# Patient Record
Sex: Male | Born: 1960
Health system: Southern US, Community
[De-identification: ages and names within clinical notes are randomized; demographics above are authoritative.]

## PROBLEM LIST (undated history)

## (undated) DIAGNOSIS — K5090 Crohn's disease, unspecified, without complications: Secondary | ICD-10-CM

## (undated) DIAGNOSIS — K5792 Diverticulitis of intestine, part unspecified, without perforation or abscess without bleeding: Secondary | ICD-10-CM

## (undated) DIAGNOSIS — J439 Emphysema, unspecified: Secondary | ICD-10-CM

## (undated) DIAGNOSIS — K219 Gastro-esophageal reflux disease without esophagitis: Secondary | ICD-10-CM

## (undated) DIAGNOSIS — K589 Irritable bowel syndrome without diarrhea: Secondary | ICD-10-CM

## (undated) DIAGNOSIS — J449 Chronic obstructive pulmonary disease, unspecified: Secondary | ICD-10-CM

## (undated) HISTORY — DX: Emphysema, unspecified: J43.9

## (undated) HISTORY — DX: Diverticulitis of intestine, part unspecified, without perforation or abscess without bleeding: K57.92

## (undated) HISTORY — DX: Crohn's disease, unspecified, without complications: K50.90

## (undated) HISTORY — PX: NASAL SEPTUM SURGERY: SHX37

## (undated) HISTORY — DX: Chronic obstructive pulmonary disease, unspecified: J44.9

## (undated) HISTORY — PX: COLONOSCOPY: SHX174

## (undated) HISTORY — PX: SHOULDER SURGERY: SHX246

## (undated) HISTORY — DX: Gastro-esophageal reflux disease without esophagitis: K21.9

## (undated) HISTORY — PX: ANKLE SURGERY: SHX546

## (undated) HISTORY — DX: Irritable bowel syndrome, unspecified: K58.9

---

## 2014-10-15 HISTORY — PX: SHOULDER SURGERY: SHX246

## 2014-12-29 DIAGNOSIS — IMO0002 Reserved for concepts with insufficient information to code with codable children: Secondary | ICD-10-CM | POA: Insufficient documentation

## 2014-12-29 DIAGNOSIS — T300 Burn of unspecified body region, unspecified degree: Secondary | ICD-10-CM | POA: Insufficient documentation

## 2015-01-10 DIAGNOSIS — M75121 Complete rotator cuff tear or rupture of right shoulder, not specified as traumatic: Secondary | ICD-10-CM | POA: Insufficient documentation

## 2015-01-18 DIAGNOSIS — M7521 Bicipital tendinitis, right shoulder: Secondary | ICD-10-CM | POA: Insufficient documentation

## 2015-02-21 DIAGNOSIS — Z9889 Other specified postprocedural states: Secondary | ICD-10-CM | POA: Insufficient documentation

## 2015-09-06 DIAGNOSIS — M7550 Bursitis of unspecified shoulder: Secondary | ICD-10-CM | POA: Insufficient documentation

## 2016-07-16 ENCOUNTER — Other Ambulatory Visit: Payer: Self-pay | Admitting: Family Medicine

## 2016-07-16 ENCOUNTER — Encounter: Payer: Self-pay | Admitting: Family Medicine

## 2016-07-16 ENCOUNTER — Ambulatory Visit (INDEPENDENT_AMBULATORY_CARE_PROVIDER_SITE_OTHER): Payer: BLUE CROSS/BLUE SHIELD | Admitting: Family Medicine

## 2016-07-16 VITALS — BP 123/79 | HR 73 | Wt 171.0 lb

## 2016-07-16 DIAGNOSIS — R195 Other fecal abnormalities: Secondary | ICD-10-CM | POA: Diagnosis not present

## 2016-07-16 DIAGNOSIS — Z114 Encounter for screening for human immunodeficiency virus [HIV]: Secondary | ICD-10-CM | POA: Diagnosis not present

## 2016-07-16 DIAGNOSIS — N400 Enlarged prostate without lower urinary tract symptoms: Secondary | ICD-10-CM

## 2016-07-16 DIAGNOSIS — R102 Pelvic and perineal pain: Secondary | ICD-10-CM

## 2016-07-16 DIAGNOSIS — Z1159 Encounter for screening for other viral diseases: Secondary | ICD-10-CM | POA: Diagnosis not present

## 2016-07-16 MED ORDER — POLYETHYLENE GLYCOL 3350 17 GM/SCOOP PO POWD: 17.0000 g | Freq: Every day | ORAL | 1 refills | Status: DC

## 2016-07-16 MED ORDER — TAMSULOSIN HCL 0.4 MG PO CAPS: 0.4000 mg | ORAL_CAPSULE | Freq: Every day | ORAL | 3 refills | Status: DC

## 2016-07-16 NOTE — Progress Notes (Signed)
Jonathan Baker is a 55 y.o. male who presents to [facility] today to establish care.  Patient has a self reported history of Chron's, IBS, GERD, and diverticulitis.  He does not take any chronic medications.  He has several concerns being addressed today.  1.  Decreased stool caliber and constipation:  These symptoms started 4 weeks ago.  Patient reports difficulty defecating because it feels "tight" in his anus.  He has tried enemas several times.  His stools have been mostly watery or thin caliber.  He also endorses difficulty passing gas and generalized abdominal pain.  He denies nausea, vomiting, weight loss, and recent bloody stools.  His last colonoscopy was 5 years ago and was significant for several small adenomatous polyps and diverticulosis per his report.  Of note he has a first degree relative family history of lymphoma, pancreatic cancer, and breast cancer.  No family history of colorectal or prostate cancer.    2.  Chronic nocturia and urinary frequency:  Patient endorses incomplete emptying as well.  He claims he has to get up 2-3 times per night to urinate.  3. Polysubstance abuse:  Patient has >50 pack year smoking history.  He currently smokes 2 packs per day and drinks a case of beer per week.  He also smokes marijuana for joint pain.    4.  Health maintenance:  Due for HIV and Hep C screening.  Patient declines Tdap and flu shots.   Past Medical History:  Diagnosis Date  . Crohn disease ([place])   . Diverticulitis   . GERD (gastroesophageal reflux disease)   . IBS (irritable bowel syndrome)    Past Surgical History:  Procedure Laterality Date  . ANKLE SURGERY    . SHOULDER SURGERY     Social History  Substance Use Topics  . Smoking status: Not on file  . Smokeless tobacco: Not on file  . Alcohol use Not on file   family history is not on  file.  ROS as above: No headache, visual changes, nausea, vomiting, diarrhea, dizziness, skin rash, fevers, chills, night sweats, weight loss, swollen lymph nodes, body aches, joint swelling, muscle aches, chest pain, shortness of breath, mood changes, visual or auditory hallucinations.    Medications: Current Outpatient Prescriptions  Medication Sig Dispense Refill  . polyethylene glycol powder (GLYCOLAX/MIRALAX) powder Take 17 g by mouth daily. 850 g 1  . tamsulosin (FLOMAX) 0.4 MG CAPS capsule Take 1 capsule (0.4 mg total) by mouth daily. 30 capsule 3   No current facility-administered medications for this visit.    Allergies  Allergen Reactions  . Tetanus Toxoid Adsorbed Other (See Comments)    Pt states his whole body gets stiff.     Exam:  BP 123/79   Pulse 73   Wt 171 lb (77.6 kg)  Gen: Well NAD HEENT: EOMI,  MMM Lungs: Normal work of breathing. CTABL Heart: RRR no MRG Abd: NABS, Soft. Nondistended, Nontender Exts: Brisk capillary refill, warm and well perfused.  Rectal:  Significantly enlarged, Firm smooth prostate.  No rectal masses  No results found for this or any previous visit (from the past 24 hour(s)). No results found.    Assessment and Plan: 55 y.o. male presents to establish care.   He has several issues addressed today:   1.  Decreased stool caliber and constipation concerning for colorectal cancer.   -  Colonoscopy referral - CT abdomen and pelvis with contrast -  Take Miralax daily - Likely refer to GI after CT scan back.  2.  Nocturia, urinary frequency, and incomplete emptying with an enlarged prostate on rectal exam concerning for BPH.  Also worried about prostate cancer, though this is less likely without systemic symptoms and a smooth/symmetric prostate on exam.  - PSA  - Tamsulosin 0.4 mg daily - Refer to urology if PSA her CT scan is concerning for prostate cancer.  3.  Polysubstance abuse:  Pre-contemplative phase - Reassess at next  visit  4. Health maintenance:  Will check general labs (CBC, CMP, TSH, Vitamin D, A1c) - Hep C and HIV screening   Orders Placed This Encounter  Procedures  . CT Abdomen Pelvis W Contrast    Standing Status:   Future    Standing Expiration Date:   10/16/2017    Order Specific Question:   If indicated for the ordered procedure, I authorize the administration of contrast media per Radiology protocol    Answer:   Yes    Order Specific Question:   Reason for Exam (SYMPTOM  OR DIAGNOSIS REQUIRED)    Answer:   eval narrow stool concern colon cancer/prostate cancer    Order Specific Question:   Preferred imaging location?    Answer:   [facility]  . CBC with Differential/Platelet  . COMPLETE METABOLIC PANEL WITH GFR  . Hemoglobin A1c  . PSA, Total and Free  . TSH  . VITAMIN D 25 Hydroxy (Vit-D Deficiency, Fractures)  . Hepatitis C antibody  . HIV antibody    Discussed warning signs or symptoms. Please see discharge instructions. Patient expresses understanding.

## 2016-07-16 NOTE — Patient Instructions (Signed)
Thank you for coming in today. Get labs today.  Get CT scan soon.   Start flomax daily for urination.  Take miralax daily for stool.   Benign Prostatic Hyperplasia An enlarged prostate (benign prostatic hyperplasia) is common in older men. You may experience the following:  Weak urine stream.  Dribbling.  Feeling like the bladder has not emptied completely.  Difficulty starting urination.  Getting up frequently at night to urinate.  Urinating more frequently during the day. HOME CARE INSTRUCTIONS  Monitor your prostatic hyperplasia for any changes. The following actions may help to alleviate any discomfort you are experiencing:  Give yourself time when you urinate.  Stay away from alcohol.  Avoid beverages containing caffeine, such as coffee, tea, and colas, because they can make the problem worse.  Avoid decongestants, antihistamines, and some prescription medicines that can make the problem worse.  Follow up with your health care provider for further treatment as recommended. SEEK MEDICAL CARE IF:  You are experiencing progressive difficulty voiding.  Your urine stream is progressively getting narrower.  You are awaking from sleep with the urge to void more frequently.  You are constantly feeling the need to void.  You experience loss of urine, especially in small amounts. SEEK IMMEDIATE MEDICAL CARE IF:   You develop increased pain with urination or are unable to urinate.  You develop severe abdominal pain, vomiting, a high fever, or fainting.  You develop back pain or blood in your urine. MAKE SURE YOU:   Understand these instructions.  Will watch your condition.  Will get help right away if you are not doing well or get worse.   This information is not intended to replace advice given to you by your health care provider. Make sure you discuss any questions you have with your health care provider.   Document Released: 10/01/2005 Document Revised:  10/22/2014 Document Reviewed: 03/03/2013 Elsevier Interactive Patient Education [employer].

## 2016-07-17 ENCOUNTER — Encounter: Payer: Self-pay | Admitting: Family Medicine

## 2016-07-17 DIAGNOSIS — E559 Vitamin D deficiency, unspecified: Secondary | ICD-10-CM | POA: Insufficient documentation

## 2016-07-17 LAB — CBC WITH DIFFERENTIAL/PLATELET
BASOS ABS: 0 {cells}/uL (ref 0–200)
Basophils Relative: 0 %
EOS PCT: 1 %
Eosinophils Absolute: 78 cells/uL (ref 15–500)
HEMATOCRIT: 43.5 % (ref 38.5–50.0)
HEMOGLOBIN: 15 g/dL (ref 13.2–17.1)
LYMPHS ABS: 1716 {cells}/uL (ref 850–3900)
Lymphocytes Relative: 22 %
MCH: 31.8 pg (ref 27.0–33.0)
MCHC: 34.5 g/dL (ref 32.0–36.0)
MCV: 92.2 fL (ref 80.0–100.0)
MONO ABS: 702 {cells}/uL (ref 200–950)
MPV: 9.9 fL (ref 7.5–12.5)
Monocytes Relative: 9 %
NEUTROS PCT: 68 %
Neutro Abs: 5304 cells/uL (ref 1500–7800)
Platelets: 347 10*3/uL (ref 140–400)
RBC: 4.72 MIL/uL (ref 4.20–5.80)
RDW: 13.4 % (ref 11.0–15.0)
WBC: 7.8 10*3/uL (ref 3.8–10.8)

## 2016-07-17 LAB — PSA, TOTAL AND FREE
PSA, % Free: 25 % — ABNORMAL LOW (ref >25)
PSA, Free: 0.3 ng/mL
PSA, Total: 1.2 ng/mL (ref <=4.0)

## 2016-07-17 LAB — COMPLETE METABOLIC PANEL WITH GFR
ALT: 14 U/L (ref 9–46)
AST: 16 U/L (ref 10–35)
Albumin: 4.5 g/dL (ref 3.6–5.1)
Alkaline Phosphatase: 72 U/L (ref 40–115)
BUN: 11 mg/dL (ref 7–25)
CHLORIDE: 101 mmol/L (ref 98–110)
CO2: 26 mmol/L (ref 20–31)
CREATININE: 0.76 mg/dL (ref 0.70–1.33)
Calcium: 9.6 mg/dL (ref 8.6–10.3)
GFR, Est Non African American: >89 mL/min (ref >=60)
Glucose, Bld: 95 mg/dL (ref 65–99)
POTASSIUM: 4.3 mmol/L (ref 3.5–5.3)
Sodium: 136 mmol/L (ref 135–146)
Total Bilirubin: 1.1 mg/dL (ref 0.2–1.2)
Total Protein: 6.9 g/dL (ref 6.1–8.1)

## 2016-07-17 LAB — HIV ANTIBODY (ROUTINE TESTING W REFLEX): HIV: NONREACTIVE

## 2016-07-17 LAB — HEPATITIS C ANTIBODY: HCV AB: NEGATIVE

## 2016-07-17 LAB — HEMOGLOBIN A1C
Hgb A1c MFr Bld: 4.8 % (ref <5.7)
MEAN PLASMA GLUCOSE: 91 mg/dL

## 2016-07-17 LAB — TSH: TSH: 0.52 m[IU]/L (ref 0.40–4.50)

## 2016-07-17 LAB — VITAMIN D 25 HYDROXY (VIT D DEFICIENCY, FRACTURES): VIT D 25 HYDROXY: 25 ng/mL — AB (ref 30–100)

## 2016-07-19 ENCOUNTER — Ambulatory Visit (INDEPENDENT_AMBULATORY_CARE_PROVIDER_SITE_OTHER): Payer: BLUE CROSS/BLUE SHIELD

## 2016-07-19 DIAGNOSIS — N2889 Other specified disorders of kidney and ureter: Secondary | ICD-10-CM

## 2016-07-19 DIAGNOSIS — R102 Pelvic and perineal pain unspecified side: Secondary | ICD-10-CM

## 2016-07-19 DIAGNOSIS — N281 Cyst of kidney, acquired: Secondary | ICD-10-CM

## 2016-07-19 DIAGNOSIS — K5900 Constipation, unspecified: Secondary | ICD-10-CM | POA: Diagnosis not present

## 2016-07-19 DIAGNOSIS — K5730 Diverticulosis of large intestine without perforation or abscess without bleeding: Secondary | ICD-10-CM

## 2016-07-19 DIAGNOSIS — N400 Enlarged prostate without lower urinary tract symptoms: Secondary | ICD-10-CM

## 2016-07-19 DIAGNOSIS — R195 Other fecal abnormalities: Secondary | ICD-10-CM | POA: Diagnosis not present

## 2016-07-19 DIAGNOSIS — Q613 Polycystic kidney, unspecified: Secondary | ICD-10-CM

## 2016-07-19 MED ORDER — IOPAMIDOL (ISOVUE-300) INJECTION 61%
100.0000 mL | Freq: Once | INTRAVENOUS | Status: AC | PRN
  Administered 2016-07-19: 100 mL via INTRAVENOUS

## 2016-07-20 ENCOUNTER — Other Ambulatory Visit: Payer: Self-pay | Admitting: Family Medicine

## 2016-07-20 ENCOUNTER — Encounter: Payer: Self-pay | Admitting: Family Medicine

## 2016-07-20 DIAGNOSIS — D1803 Hemangioma of intra-abdominal structures: Secondary | ICD-10-CM | POA: Insufficient documentation

## 2016-07-20 DIAGNOSIS — K5790 Diverticulosis of intestine, part unspecified, without perforation or abscess without bleeding: Secondary | ICD-10-CM | POA: Insufficient documentation

## 2016-07-20 DIAGNOSIS — N2889 Other specified disorders of kidney and ureter: Secondary | ICD-10-CM

## 2016-07-20 DIAGNOSIS — N281 Cyst of kidney, acquired: Secondary | ICD-10-CM | POA: Insufficient documentation

## 2016-07-20 DIAGNOSIS — Q613 Polycystic kidney, unspecified: Secondary | ICD-10-CM | POA: Insufficient documentation

## 2016-07-20 DIAGNOSIS — I700 Atherosclerosis of aorta: Secondary | ICD-10-CM | POA: Insufficient documentation

## 2016-07-20 NOTE — Progress Notes (Signed)
I discussed results of the abdomen MRI with patient who expresses understanding and agreement..  Plan to follow-up with both gastroenterology and nephrologyI discussed results of the abdomen MRI with patient who expresses understanding and agreement..  Plan to follow-up with both gastroenterology and nephrology

## 2016-07-23 ENCOUNTER — Ambulatory Visit: Payer: BLUE CROSS/BLUE SHIELD

## 2016-07-23 DIAGNOSIS — N2889 Other specified disorders of kidney and ureter: Secondary | ICD-10-CM

## 2016-07-23 DIAGNOSIS — Q612 Polycystic kidney, adult type: Secondary | ICD-10-CM | POA: Diagnosis not present

## 2016-07-23 MED ORDER — GADOBENATE DIMEGLUMINE 529 MG/ML IV SOLN
20.0000 mL | Freq: Once | INTRAVENOUS | Status: AC | PRN
  Administered 2016-07-23: 16 mL via INTRAVENOUS

## 2016-07-24 ENCOUNTER — Telehealth: Payer: Self-pay | Admitting: Family Medicine

## 2016-07-24 NOTE — Telephone Encounter (Signed)
I discussed results of the abdomen MRI with patient who expresses understanding and agreement..  Plan to follow-up with both gastroenterology and nephrology

## 2016-08-01 ENCOUNTER — Other Ambulatory Visit: Payer: Self-pay | Admitting: Family Medicine

## 2016-08-01 DIAGNOSIS — Q613 Polycystic kidney, unspecified: Secondary | ICD-10-CM

## 2016-08-15 DIAGNOSIS — N529 Male erectile dysfunction, unspecified: Secondary | ICD-10-CM | POA: Insufficient documentation

## 2016-08-15 DIAGNOSIS — N400 Enlarged prostate without lower urinary tract symptoms: Secondary | ICD-10-CM | POA: Insufficient documentation

## 2016-08-15 DIAGNOSIS — N281 Cyst of kidney, acquired: Secondary | ICD-10-CM | POA: Diagnosis not present

## 2016-08-15 DIAGNOSIS — Q613 Polycystic kidney, unspecified: Secondary | ICD-10-CM | POA: Diagnosis not present

## 2016-11-09 ENCOUNTER — Other Ambulatory Visit: Payer: Self-pay

## 2016-11-09 DIAGNOSIS — R195 Other fecal abnormalities: Secondary | ICD-10-CM

## 2016-11-09 MED ORDER — POLYETHYLENE GLYCOL 3350 17 GM/SCOOP PO POWD: 17.0000 g | Freq: Every day | ORAL | 1 refills | Status: DC

## 2016-11-15 ENCOUNTER — Other Ambulatory Visit: Payer: Self-pay

## 2016-11-15 DIAGNOSIS — R195 Other fecal abnormalities: Secondary | ICD-10-CM

## 2016-11-15 DIAGNOSIS — N400 Enlarged prostate without lower urinary tract symptoms: Secondary | ICD-10-CM

## 2016-11-15 MED ORDER — TAMSULOSIN HCL 0.4 MG PO CAPS: 0.4000 mg | ORAL_CAPSULE | Freq: Every day | ORAL | 3 refills | Status: DC

## 2016-11-15 MED ORDER — POLYETHYLENE GLYCOL 3350 17 GM/SCOOP PO POWD: 17.0000 g | Freq: Every day | ORAL | 1 refills | Status: DC

## 2016-11-22 ENCOUNTER — Other Ambulatory Visit: Payer: Self-pay | Admitting: Family Medicine

## 2016-11-22 DIAGNOSIS — R195 Other fecal abnormalities: Secondary | ICD-10-CM

## 2016-11-27 ENCOUNTER — Telehealth: Payer: Self-pay | Admitting: Family Medicine

## 2016-11-27 NOTE — Telephone Encounter (Signed)
I called pt and spoke with him and he states that he DECLINES wanting a flu shot this year so please document this in pts chart. Thanks

## 2016-11-27 NOTE — Telephone Encounter (Signed)
Health maintenance updated

## 2017-03-05 DIAGNOSIS — R399 Unspecified symptoms and signs involving the genitourinary system: Secondary | ICD-10-CM | POA: Diagnosis not present

## 2017-03-05 DIAGNOSIS — N529 Male erectile dysfunction, unspecified: Secondary | ICD-10-CM | POA: Diagnosis not present

## 2017-03-05 DIAGNOSIS — Q613 Polycystic kidney, unspecified: Secondary | ICD-10-CM | POA: Diagnosis not present

## 2017-04-14 ENCOUNTER — Other Ambulatory Visit: Payer: Self-pay | Admitting: Family Medicine

## 2017-04-14 DIAGNOSIS — R195 Other fecal abnormalities: Secondary | ICD-10-CM

## 2017-04-26 ENCOUNTER — Other Ambulatory Visit: Payer: Self-pay | Admitting: *Deleted

## 2017-04-26 DIAGNOSIS — R195 Other fecal abnormalities: Secondary | ICD-10-CM

## 2017-04-26 MED ORDER — POLYETHYLENE GLYCOL 3350 17 GM/SCOOP PO POWD: ORAL | 1 refills | Status: DC

## 2017-04-29 ENCOUNTER — Ambulatory Visit: Payer: BLUE CROSS/BLUE SHIELD | Admitting: Family Medicine

## 2017-08-24 DIAGNOSIS — Q613 Polycystic kidney, unspecified: Secondary | ICD-10-CM | POA: Diagnosis not present

## 2017-08-29 IMAGING — MR MR ABDOMEN WO/W CM
17 of 18 series · 44 of 48 positions shown · IV contrast (multihance)
Comparison: 07/19/2016

CLINICAL DATA: Evaluate right kidney mass

EXAM:
MRI ABDOMEN WITHOUT AND WITH CONTRAST
TECHNIQUE: Multiplanar multisequence MR imaging of the abdomen was performed
both before and after the administration of intravenous contrast.
CONTRAST:  16mL MULTIHANCE GADOBENATE DIMEGLUMINE 529 MG/ML IV SOLN

[Series 2: cor ssfse / · coronal · 7.0mm · 1.48mm/px · 3 of 34 slices shown]
[im 1/34]
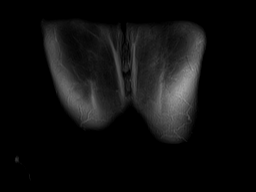
[im 17/34]
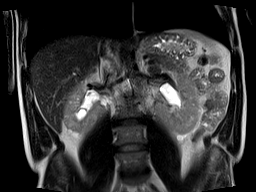
[im 34/34]
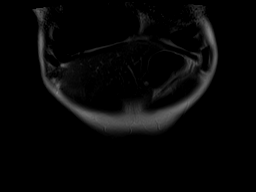

[Series 3: T2 · axial · 6.0mm · 1.48mm/px · z∈[-39,+170]mm · 2 of 30 slices shown]
[im 1/30]
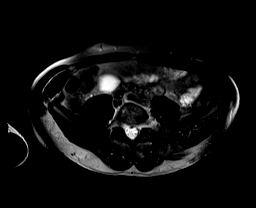
[im 30/30]
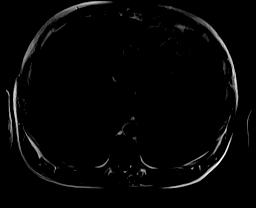

[Series 6: DWI · axial · 6.0mm · 2.00mm/px · z∈[-35,+166]mm · 5 of 87 slices shown (1 of 2)]
[im 1/87]
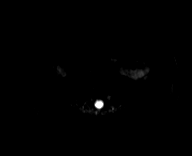
[im 22/87]
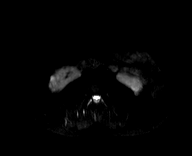
[im 44/87]
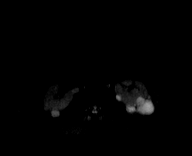
[im 65/87]
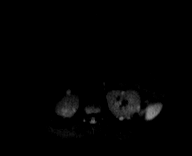
[im 87/87]
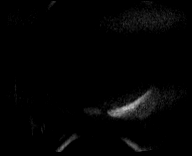

[Series 7: DWI · axial · 6.0mm · 2.00mm/px · z∈[-35,+166]mm · 2 of 29 slices shown (2 of 2)]
[im 1/29]
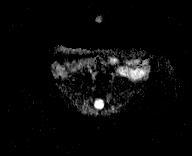
[im 29/29]
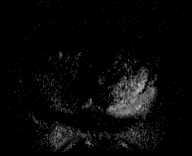

[Series 8: bSSFP · axial · 6.0mm · 0.74mm/px · z∈[-39,+170]mm · 2 of 30 slices shown]
[im 1/30]
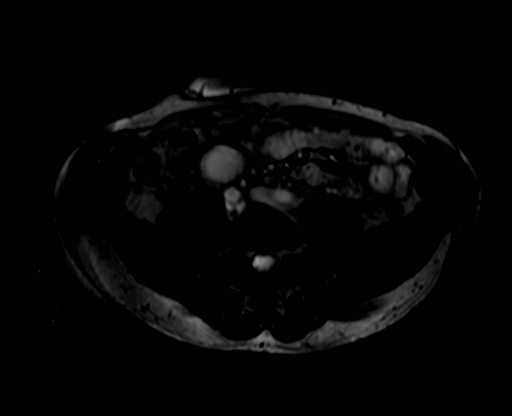
[im 30/30]
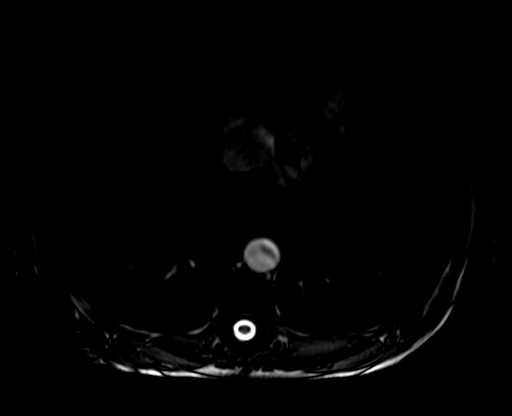

[Series 9: axial dynamic pre · axial · non-contrast · 4.0mm · 1.19mm/px · z∈[-60,+192]mm · 3 of 64 slices shown]
[im 1/64]
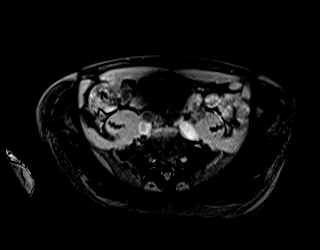
[im 32/64]
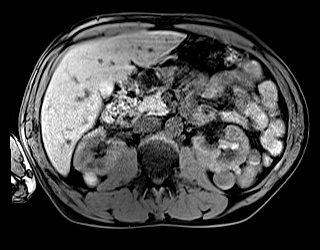
[im 64/64]
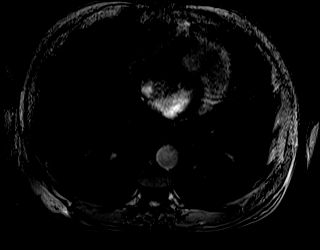

[Series 10: axial dynamic post · axial · 3.0mm · 1.19mm/px · z∈[-29,+160]mm · 3 of 64 slices shown (1 of 3)]
[im 1/64]
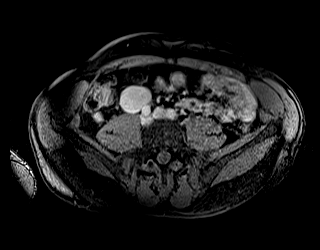
[im 32/64]
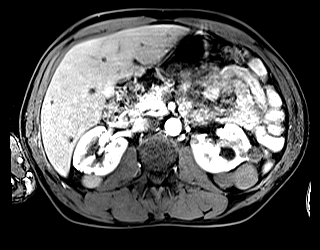
[im 64/64]
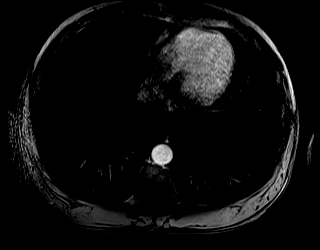

[Series 11: axial dynamic post · axial · 3.0mm · 1.19mm/px · z∈[-29,+160]mm · 3 of 64 slices shown (2 of 3)]
[im 1/64]
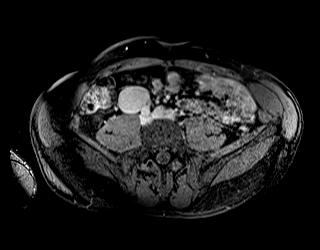
[im 32/64]
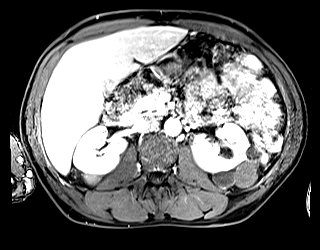
[im 64/64]
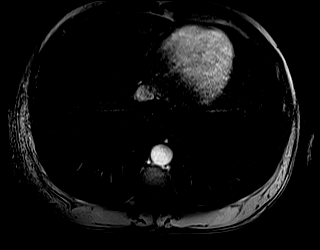

[Series 12: axial dynamic post · axial · 3.0mm · 1.19mm/px · z∈[-29,+160]mm · 3 of 64 slices shown (3 of 3)]
[im 1/64]
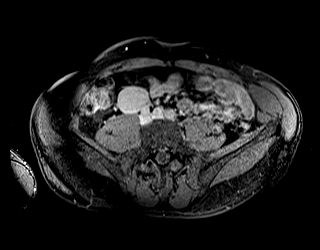
[im 32/64]
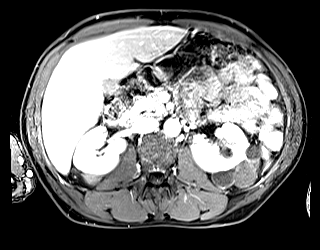
[im 64/64]
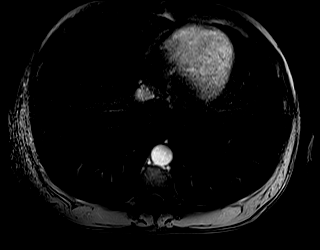

[Series 14: axial ssfse / · axial · 6.0mm · 1.19mm/px · 1 of 30 slices shown]
[im 1/30]
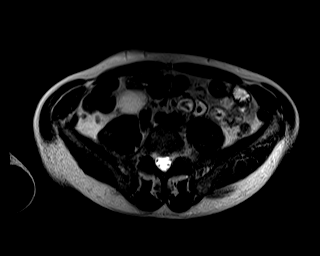

[Series 15: axial dynamic 3 · axial · 4.0mm · 1.19mm/px · z∈[-60,+192]mm · 3 of 64 slices shown]
[im 1/64]
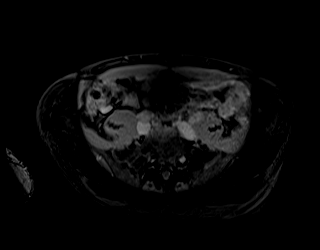
[im 32/64]
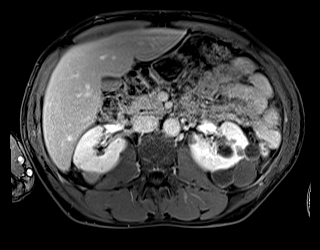
[im 64/64]
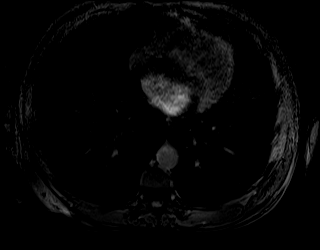

[Series 100: out of phase · axial · 6.0mm · 0.74mm/px · 1 of 30 slices shown]
[im 1/30]
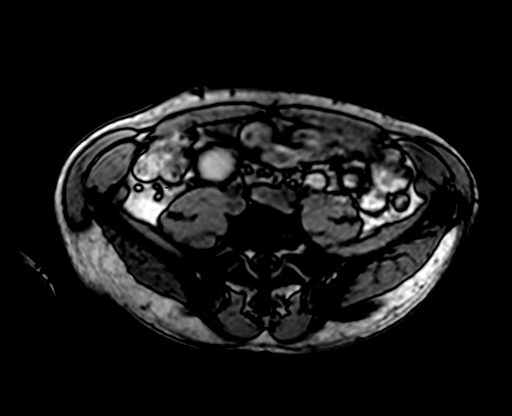

[Series 101: in phase · axial · 6.0mm · 0.74mm/px · 1 of 30 slices shown]
[im 1/30]
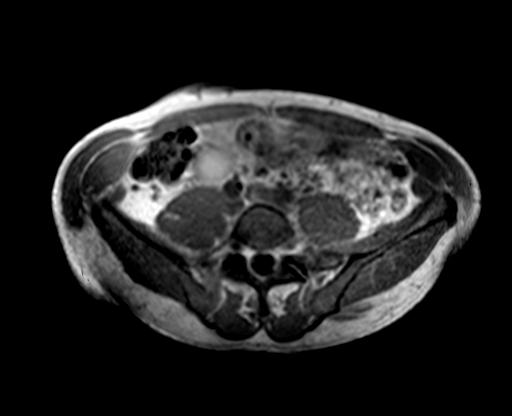

[Series 102: immediate · axial · 1.19mm/px · z∈[-45,+176]mm · 3 of 64 slices shown]
[im 1/64]
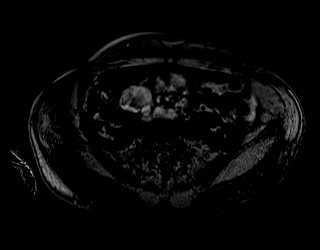
[im 32/64]
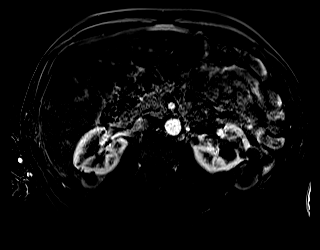
[im 64/64]
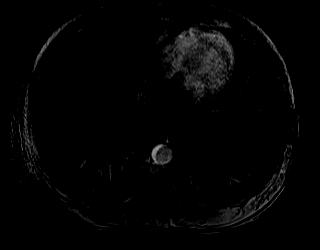

[Series 103: 45 sec delay · axial · delayed · 1.19mm/px · z∈[-45,+176]mm · 3 of 64 slices shown]
[im 1/64]
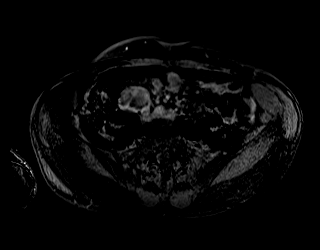
[im 32/64]
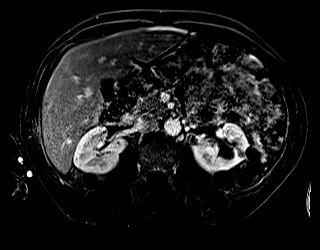
[im 64/64]
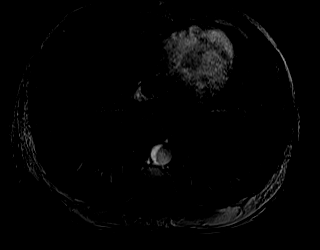

[Series 104: 90 sec delay · axial · delayed · 1.19mm/px · z∈[-45,+176]mm · 3 of 64 slices shown]
[im 1/64]
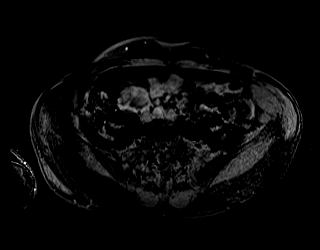
[im 32/64]
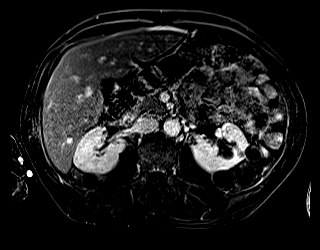
[im 64/64]
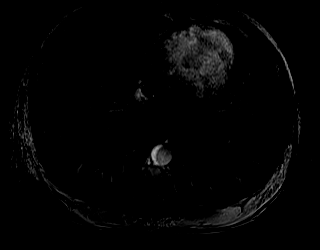

[Series 105: 3 min delay · axial · delayed · 4.0mm · 1.19mm/px · z∈[-60,+192]mm · 3 of 64 slices shown]
[im 1/64]
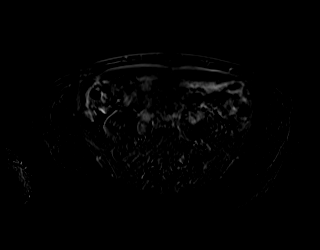
[im 32/64]
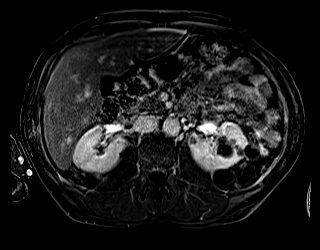
[im 64/64]
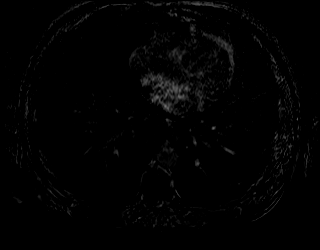

[44 of 48 positions shown; findings below may reference images not displayed]

FINDINGS: Lower chest: No pleural or pericardial effusion identified.

Hepatobiliary: Hemangioma along the dome of liver measures 1 cm,
image 5 of series 3. No suspicious enhancing kidney lesions
identified. Several tiny cysts are noted within the right lobe of
liver.

Pancreas: No mass, inflammatory changes, or other parenchymal
abnormality identified.

Spleen:  Within normal limits in size and appearance.

Adrenals/Urinary Tract: The adrenal glands appear normal. There are
numerous bilateral renal cysts of varying complexity. Some of these
are simple cysts. Others are hemorrhagic/proteinaceous cysts. The
lesion of concern arising from the posterior aspect of the upper
pole of the left kidney measures 2.8 cm. There is no enhancement
associated with this structure compatible with a Bosniak category 2
lesion. No definite enhancing kidney lesions identified at this
time.

Stomach/Bowel: Visualized portions within the abdomen are
unremarkable.

Vascular/Lymphatic: Normal appearance of the abdominal aorta. No
upper abdominal adenopathy.

Other: No free fluid or fluid collections identified within the
abdomen or pelvis.

Musculoskeletal: No suspicious bone lesions identified.
IMPRESSION: 1. Again noted are changes of polycystic kidney disease.
2. Multiple liver cysts have signal and enhancement characteristics
compatible with Bosniak category 1 and 2 lesions. No enhancing
kidney lesions identified at this time.
3. Liver hemangioma.

## 2018-04-16 ENCOUNTER — Ambulatory Visit (INDEPENDENT_AMBULATORY_CARE_PROVIDER_SITE_OTHER): Payer: BLUE CROSS/BLUE SHIELD | Admitting: Physician Assistant

## 2018-04-16 ENCOUNTER — Encounter: Payer: Self-pay | Admitting: Physician Assistant

## 2018-04-16 VITALS — BP 125/79 | HR 73 | Temp 98.1°F | Wt 175.0 lb

## 2018-04-16 DIAGNOSIS — L237 Allergic contact dermatitis due to plants, except food: Secondary | ICD-10-CM

## 2018-04-16 MED ORDER — PREDNISONE 20 MG PO TABS: ORAL_TABLET | ORAL | 0 refills | Status: DC

## 2018-04-16 MED ORDER — CLOBETASOL PROPIONATE 0.05 % EX CREA: 1.0000 "application " | TOPICAL_CREAM | Freq: Two times a day (BID) | CUTANEOUS | 0 refills | Status: AC

## 2018-04-16 NOTE — Progress Notes (Signed)
HPI:                                                                Jonathan Baker is a 57 y.o. male who presents to [facility]: [facility] today for rash  Onset: today Location: started on wrists and right , has spread to abdomen Duration: constant Character: extremely itchy Aggravating factors / Triggers: none Evolution: red, swollen bumps Treatments tried: anti-itch cream and Benadryl Reports cutting down some trees this weekend and weeding without wearing gloves Recent illness / systemic symptoms: none       No flowsheet data found.  No flowsheet data found.    Past Medical History:  Diagnosis Date  . Crohn disease ([place])   . Diverticulitis   . GERD (gastroesophageal reflux disease)   . IBS (irritable bowel syndrome)    Past Surgical History:  Procedure Laterality Date  . ANKLE SURGERY    . NASAL SEPTUM SURGERY    . SHOULDER SURGERY     Social History   Tobacco Use  . Smoking status: Current Every Day Smoker  . Smokeless tobacco: Never Used  Substance Use Topics  . Alcohol use: Yes   family history is not on file.    ROS: negative except as noted in the HPI  Medications: Current Outpatient Medications  Medication Sig Dispense Refill  . polyethylene glycol powder (GLYCOLAX/MIRALAX) powder take 17GM (DISSOLVED IN WATER) by mouth daily 850 g 1  . tamsulosin (FLOMAX) 0.4 MG CAPS capsule Take 1 capsule (0.4 mg total) by mouth daily. 30 capsule 3  . clobetasol cream (TEMOVATE) 9.41 % Apply 1 application topically 2 (two) times daily for 7 days. 15 g 0  . predniSONE (DELTASONE) 20 MG tablet 60 mg PO QD x 1 week, 40 mg PO QD x 1 week, 20 mg PO QD x 1 week 42 tablet 0   No current facility-administered medications for this visit.    Allergies  Allergen Reactions  . Tetanus Toxoid Adsorbed Other (See Comments)    Pt states his whole body gets stiff.       Objective:  BP 125/79   Pulse 73   Temp 98.1 F (36.7 C)  (Oral)   Wt 175 lb (79.4 kg)  Gen:  alert, not ill-appearing, no distress, appropriate for age HEENT: head normocephalic without obvious abnormality, conjunctiva and cornea clear, trachea midline Pulm: Normal work of breathing, normal phonation Neuro: alert and oriented x 3, no tremor MSK: extremities atraumatic, normal gait and station Skin: right eyelid/peiorbital area and right lateral neck with erythematous non-vesicular eruption and mild edema, scattered erythematous papules on the right forearm and abdomen, left wrist with erythematous eruption    No results found for this or any previous visit (from the past 72 hour(s)). No results found.    Assessment and Plan: 57 y.o. male with   Allergic contact dermatitis due to plants, except food - Plan: predniSONE (DELTASONE) 20 MG tablet, clobetasol cream (TEMOVATE) 0.05 % - due to facial involvement, recommend oral corticosteroids. 21 day prednisone taper. Clobetasol bid prn x 7 days, refrigerate for soothing effect. Counseled on general measures Cool compresses for 15-20 minutes per hour Colloidal Oatmeal Bath (Aveeno bath) Calamine lotion applied several times per day  Patient  education and anticipatory guidance given Patient agrees with treatment plan Follow-up as needed if symptoms worsen or fail to improve  Darlyne Russian PA-C

## 2018-04-16 NOTE — Patient Instructions (Addendum)
Symptomatic Treatment (sooth and dry weeping lesions) Clobetasol cream (refrigerate), apply up to two times daily to affected areas. Avoid contact with face or genitals. Do not use for more than 7 days Cool compresses for 15-20 minutes per hour Colloidal Oatmeal Bath (Aveeno bath) Calamine lotion applied several times per day   Contact Dermatitis Dermatitis is redness, soreness, and swelling (inflammation) of the skin. Contact dermatitis is a reaction to certain substances that touch the skin. There are two types of contact dermatitis:  Irritant contact dermatitis. This type is caused by something that irritates your skin, such as dry hands from washing them too much. This type does not require previous exposure to the substance for a reaction to occur. This type is more common.  Allergic contact dermatitis. This type is caused by a substance that you are allergic to, such as a nickel allergy or poison ivy. This type only occurs if you have been exposed to the substance (allergen) before. Upon a repeat exposure, your body reacts to the substance. This type is less common.  What are the causes? Many different substances can cause contact dermatitis. Irritant contact dermatitis is most commonly caused by exposure to:  Makeup.  Soaps.  Detergents.  Bleaches.  Acids.  Metal salts, such as nickel.  Allergic contact dermatitis is most commonly caused by exposure to:  Poisonous plants.  Chemicals.  Jewelry.  Latex.  Medicines.  Preservatives in products, such as clothing.  What increases the risk? This condition is more likely to develop in:  People who have jobs that expose them to irritants or allergens.  People who have certain medical conditions, such as asthma or eczema.  What are the signs or symptoms? Symptoms of this condition may occur anywhere on your body where the irritant has touched you or is touched by you. Symptoms include:  Dryness or  flaking.  Redness.  Cracks.  Itching.  Pain or a burning feeling.  Blisters.  Drainage of small amounts of blood or clear fluid from skin cracks.  With allergic contact dermatitis, there may also be swelling in areas such as the eyelids, mouth, or genitals. How is this diagnosed? This condition is diagnosed with a medical history and physical exam. A patch skin test may be performed to help determine the cause. If the condition is related to your job, you may need to see an occupational medicine specialist. How is this treated? Treatment for this condition includes figuring out what caused the reaction and protecting your skin from further contact. Treatment may also include:  Steroid creams or ointments. Oral steroid medicines may be needed in more severe cases.  Antibiotics or antibacterial ointments, if a skin infection is present.  Antihistamine lotion or an antihistamine taken by mouth to ease itching.  A bandage (dressing).  Follow these instructions at home: [place] your skin as needed.  Apply cool compresses to the affected areas.  Try taking a bath with: ? Epsom salts. Follow the instructions on the packaging. You can get these at your local pharmacy or grocery store. ? Baking soda. Pour a small amount into the bath as directed by your health care provider. ? Colloidal oatmeal. Follow the instructions on the packaging. You can get this at your local pharmacy or grocery store.  Try applying baking soda paste to your skin. Stir water into baking soda until it reaches a paste-like consistency.  Do not scratch your skin.  Bathe less frequently, such as every other day.  Bathe  in lukewarm water. Avoid using hot water. Medicines  Take or apply over-the-counter and prescription medicines only as told by your health care provider.  If you were prescribed an antibiotic medicine, take or apply your antibiotic as told by your health care provider. Do not  stop using the antibiotic even if your condition starts to improve. General instructions  Keep all follow-up visits as told by your health care provider. This is important.  Avoid the substance that caused your reaction. If you do not know what caused it, keep a journal to try to track what caused it. Write down: ? What you eat. ? What cosmetic products you use. ? What you drink. ? What you wear in the affected area. This includes jewelry.  If you were given a dressing, take care of it as told by your health care provider. This includes when to change and remove it. Contact a health care provider if:  Your condition does not improve with treatment.  Your condition gets worse.  You have signs of infection such as swelling, tenderness, redness, soreness, or warmth in the affected area.  You have a fever.  You have new symptoms. Get help right away if:  You have a severe headache, neck pain, or neck stiffness.  You vomit.  You feel very sleepy.  You notice red streaks coming from the affected area.  Your bone or joint underneath the affected area becomes painful after the skin has healed.  The affected area turns darker.  You have difficulty breathing. This information is not intended to replace advice given to you by your health care provider. Make sure you discuss any questions you have with your health care provider. Document Released: 09/28/2000 Document Revised: 03/08/2016 Document Reviewed: 02/16/2015 Elsevier Interactive Patient Education  2018 [employer].

## 2018-04-30 ENCOUNTER — Ambulatory Visit (INDEPENDENT_AMBULATORY_CARE_PROVIDER_SITE_OTHER): Payer: BLUE CROSS/BLUE SHIELD | Admitting: Family Medicine

## 2018-04-30 ENCOUNTER — Encounter: Payer: Self-pay | Admitting: Family Medicine

## 2018-04-30 VITALS — BP 162/81 | HR 79 | Temp 98.0°F | Wt 176.0 lb

## 2018-04-30 DIAGNOSIS — N400 Enlarged prostate without lower urinary tract symptoms: Secondary | ICD-10-CM

## 2018-04-30 DIAGNOSIS — R351 Nocturia: Secondary | ICD-10-CM | POA: Diagnosis not present

## 2018-04-30 DIAGNOSIS — R21 Rash and other nonspecific skin eruption: Secondary | ICD-10-CM | POA: Diagnosis not present

## 2018-04-30 DIAGNOSIS — N401 Enlarged prostate with lower urinary tract symptoms: Secondary | ICD-10-CM | POA: Diagnosis not present

## 2018-04-30 MED ORDER — TRIAMCINOLONE ACETONIDE 0.5 % EX CREA
1.0000 | TOPICAL_CREAM | Freq: Two times a day (BID) | CUTANEOUS | 3 refills | Status: DC
Start: 2018-04-30 — End: 2019-05-13

## 2018-04-30 MED ORDER — TAMSULOSIN HCL 0.4 MG PO CAPS: 0.4000 mg | ORAL_CAPSULE | Freq: Every day | ORAL | 3 refills | Status: DC

## 2018-04-30 MED ORDER — DOXYCYCLINE HYCLATE 100 MG PO TABS: 100.0000 mg | ORAL_TABLET | Freq: Two times a day (BID) | ORAL | 0 refills | Status: DC

## 2018-04-30 MED ORDER — HYDROXYZINE HCL 50 MG PO TABS: 50.0000 mg | ORAL_TABLET | Freq: Every evening | ORAL | 3 refills | Status: DC | PRN

## 2018-04-30 NOTE — Patient Instructions (Addendum)
Thank you for coming in today. Complete prednisone course.  Take doxycycline twice dialy for 1 week.  Take certizine (generic zyrtec) twice daily for itching.  Take Hydoxyzine at bedtime for itching.  Use the big tub of cream on and arms and torso.  Use the little tube on the hot spots.   If not better in 1 week let me know.   Use chlorhexadine body wash.   Hibiclens.     Folliculitis Folliculitis is inflammation of the hair follicles. Folliculitis most commonly occurs on the scalp, thighs, legs, back, and buttocks. However, it can occur anywhere on the body. What are the causes? This condition may be caused by:  A bacterial infection (common).  A fungal infection.  A viral infection.  Coming into contact with certain chemicals, especially oils and tars.  Shaving or waxing.  Applying greasy ointments or creams to your skin often.  Long-lasting folliculitis and folliculitis that keeps coming back can be caused by bacteria that live in the nostrils. What increases the risk? This condition is more likely to develop in people with:  A weakened immune system.  Diabetes.  Obesity.  What are the signs or symptoms? Symptoms of this condition include:  Redness.  Soreness.  Swelling.  Itching.  Small white or yellow, pus-filled, itchy spots (pustules) that appear over a reddened area. If there is an infection that goes deep into the follicle, these may develop into a boil (furuncle).  A group of closely packed boils (carbuncle). These tend to form in hairy, sweaty areas of the body.  How is this diagnosed? This condition is diagnosed with a skin exam. To find what is causing the condition, your health care provider may take a sample of one of the pustules or boils for testing. How is this treated? This condition may be treated by:  Applying warm compresses to the affected areas.  Taking an antibiotic medicine or applying an antibiotic medicine to the  skin.  Applying or bathing with an antiseptic solution.  Taking an over-the-counter medicine to help with itching.  Having a procedure to drain any pustules or boils. This may be done if a pustule or boil contains a lot of pus or fluid.  Laser hair removal. This may be done to treat long-lasting folliculitis.  Follow these instructions at home:  If directed, apply heat to the affected area as often as told by your health care provider. Use the heat source that your health care provider recommends, such as a moist heat pack or a heating pad. ? Place a towel between your skin and the heat source. ? Leave the heat on for 20-30 minutes. ? Remove the heat if your skin turns bright red. This is especially important if you are unable to feel pain, heat, or cold. You may have a greater risk of getting burned.  If you were prescribed an antibiotic medicine, use it as told by your health care provider. Do not stop using the antibiotic even if you start to feel better.  Take over-the-counter and prescription medicines only as told by your health care provider.  Do not shave irritated skin.  Keep all follow-up visits as told by your health care provider. This is important. Get help right away if:  You have more redness, swelling, or pain in the affected area.  Red streaks are spreading from the affected area.  You have a fever. This information is not intended to replace advice given to you by your health care provider.  Make sure you discuss any questions you have with your health care provider. Document Released: 12/10/2001 Document Revised: 04/20/2016 Document Reviewed: 07/22/2015 Elsevier Interactive Patient Education  2018 [employer].

## 2018-05-01 NOTE — Progress Notes (Signed)
Jonathan Baker is a 57 y.o. male who presents to [facility] today for rash.  Jonathan Baker notes a pruritic papular rash across his trunk and upper extremities.  He has been seen about 2 weeks ago and prescribed prednisone and topical clobetasol.  He notes this has not helped much at all.  He notes the rash is itchy and associated with small red bumps and sores.  He denies pain fevers chills nausea vomiting or diarrhea.  He denies any new exposures soaps detergents or shampoos.  Additionally he notes a history of BPH typically well managed with Flomax.  He is run out and would like a refill if possible.   ROS as above:  Exam:  BP (!) 162/81   Pulse 79   Temp 98 F (36.7 C) (Oral)   Wt 176 lb (79.8 kg)  Gen: Well NAD HEENT: EOMI,  MMM Lungs: Normal work of breathing. CTABL Heart: RRR no MRG Abd: NABS, Soft. Nondistended, Nontender Exts: Brisk capillary refill, warm and well perfused.  Skin upper extremities significantly covered by tattoos.  Multiple small erythematous papules on upper extremities and chest.  Minimally tender.    Assessment and Plan: 57 y.o. male with folliculitis.  Very likely.  Plan for trial of doxycycline.  Additionally will treat pruritus with large amount of triamcinolone cream.  Use hydroxyzine as needed for bedtime itching.  Recheck in 1 week or so.  BPH: Flomax refilled.  Hypertension: Not well controlled.  It has been sometime since patient was last seen in clinic for general medical care.  Recommend recheck in the near future.   No orders of the defined types were placed in this encounter.  Meds ordered this encounter  Medications  . tamsulosin (FLOMAX) 0.4 MG CAPS capsule    Sig: Take 1 capsule (0.4 mg total) by mouth daily.    Dispense:  90 capsule    Refill:  3  . triamcinolone cream (KENALOG) 0.5 %    Sig: Apply 1 application  topically 2 (two) times daily. To affected areas.    Dispense:  454 g    Refill:  3  . doxycycline (VIBRA-TABS) 100 MG tablet    Sig: Take 1 tablet (100 mg total) by mouth 2 (two) times daily.    Dispense:  14 tablet    Refill:  0  . hydrOXYzine (ATARAX/VISTARIL) 50 MG tablet    Sig: Take 1 tablet (50 mg total) by mouth at bedtime and may repeat dose one time if needed. For itching.    Dispense:  30 tablet    Refill:  3     Historical information moved to improve visibility of documentation.  Past Medical History:  Diagnosis Date  . Crohn disease ([place])   . Diverticulitis   . GERD (gastroesophageal reflux disease)   . IBS (irritable bowel syndrome)    Past Surgical History:  Procedure Laterality Date  . ANKLE SURGERY    . NASAL SEPTUM SURGERY    . SHOULDER SURGERY     Social History   Tobacco Use  . Smoking status: Current Every Day Smoker  . Smokeless tobacco: Never Used  Substance Use Topics  . Alcohol use: Yes   family history is not on file.  Medications: Current Outpatient Medications  Medication Sig Dispense Refill  . Cholecalciferol (VITAMIN D3) 1000 units CAPS Take by mouth.    . Multiple Vitamins-Minerals (CENTRUM ADULTS PO) Take 1 tablet by mouth.    Marland Kitchen  predniSONE (DELTASONE) 20 MG tablet 60 mg PO QD x 1 week, 40 mg PO QD x 1 week, 20 mg PO QD x 1 week 42 tablet 0  . tamsulosin (FLOMAX) 0.4 MG CAPS capsule Take 1 capsule (0.4 mg total) by mouth daily. 90 capsule 3  . doxycycline (VIBRA-TABS) 100 MG tablet Take 1 tablet (100 mg total) by mouth 2 (two) times daily. 14 tablet 0  . hydrOXYzine (ATARAX/VISTARIL) 50 MG tablet Take 1 tablet (50 mg total) by mouth at bedtime and may repeat dose one time if needed. For itching. 30 tablet 3  . triamcinolone cream (KENALOG) 0.5 % Apply 1 application topically 2 (two) times daily. To affected areas. 454 g 3   No current facility-administered medications for this visit.    Allergies  Allergen Reactions  . Tetanus  Toxoid Adsorbed Other (See Comments)    Pt states his whole body gets stiff.     Discussed warning signs or symptoms. Please see discharge instructions. Patient expresses understanding.

## 2018-05-02 ENCOUNTER — Telehealth: Payer: Self-pay

## 2018-05-02 NOTE — Telephone Encounter (Signed)
Looks like you solved the mystery.

## 2018-05-02 NOTE — Telephone Encounter (Signed)
The topical clobetasol cream prescribed on July 3 by Sherlie Ban is the first cream and the large tub of cream I prescribed in the 17th is the second cream.  I prescribed 454 g which is 1 pound which is typically dispensed in a large tub.  It is possible the pharmacy instead split it into a bunch of tubes.  You should have 0.5% triamcinolone cream.  Please let me know if you do not.

## 2018-05-02 NOTE — Telephone Encounter (Signed)
As per pt, he did received 4 tubes of 0.5% triamcinolone cream. Pt was expecting the tub container, but is okay with the dispense of the tubes. Pt had no other inquiries during phone call.

## 2018-05-02 NOTE — Telephone Encounter (Signed)
Pt called stating that he is still awaiting another rx for a "tub of cream" for the itching as per provider. Pt has kenalog cream but states he was to have two separate creams. Please advise. Thanks.

## 2019-05-07 ENCOUNTER — Other Ambulatory Visit: Payer: Self-pay | Admitting: Sports Medicine

## 2019-05-07 DIAGNOSIS — N400 Enlarged prostate without lower urinary tract symptoms: Secondary | ICD-10-CM

## 2019-05-07 MED ORDER — TAMSULOSIN HCL 0.4 MG PO CAPS: 0.4000 mg | ORAL_CAPSULE | Freq: Every day | ORAL | 0 refills | Status: DC

## 2019-05-08 ENCOUNTER — Other Ambulatory Visit: Payer: Self-pay | Admitting: Family Medicine

## 2019-05-08 DIAGNOSIS — N400 Enlarged prostate without lower urinary tract symptoms: Secondary | ICD-10-CM

## 2019-05-13 ENCOUNTER — Encounter: Payer: Self-pay | Admitting: Family Medicine

## 2019-05-13 ENCOUNTER — Other Ambulatory Visit: Payer: Self-pay

## 2019-05-13 ENCOUNTER — Ambulatory Visit (INDEPENDENT_AMBULATORY_CARE_PROVIDER_SITE_OTHER): Payer: BC Managed Care – PPO | Admitting: Family Medicine

## 2019-05-13 VITALS — BP 118/65 | HR 67 | Temp 98.4°F | Wt 167.0 lb

## 2019-05-13 DIAGNOSIS — R55 Syncope and collapse: Secondary | ICD-10-CM | POA: Diagnosis not present

## 2019-05-13 DIAGNOSIS — N281 Cyst of kidney, acquired: Secondary | ICD-10-CM

## 2019-05-13 DIAGNOSIS — N401 Enlarged prostate with lower urinary tract symptoms: Secondary | ICD-10-CM | POA: Diagnosis not present

## 2019-05-13 DIAGNOSIS — Q613 Polycystic kidney, unspecified: Secondary | ICD-10-CM

## 2019-05-13 DIAGNOSIS — R351 Nocturia: Secondary | ICD-10-CM

## 2019-05-13 DIAGNOSIS — D1803 Hemangioma of intra-abdominal structures: Secondary | ICD-10-CM

## 2019-05-13 DIAGNOSIS — R5383 Other fatigue: Secondary | ICD-10-CM

## 2019-05-13 MED ORDER — TRAZODONE HCL 50 MG PO TABS: 25.0000 mg | ORAL_TABLET | Freq: Every evening | ORAL | 3 refills | Status: DC | PRN

## 2019-05-13 NOTE — Patient Instructions (Signed)
Thank you for coming in today. Get labs today.  Use trazodone as needed for insomnia.  Recheck if not improving pretty quickly.  Otherwise we should do a physical when feeling better in about 2-3 months.     Fatigue If you have fatigue, you feel tired all the time and have a lack of energy or a lack of motivation. Fatigue may make it difficult to start or complete tasks because of exhaustion. In general, occasional or mild fatigue is often a normal response to activity or life. However, long-lasting (chronic) or extreme fatigue may be a symptom of a medical condition. Follow these instructions at home: General instructions  Watch your fatigue for any changes.  Go to bed and get up at the same time every day.  Avoid fatigue by pacing yourself during the day and getting enough sleep at night.  Maintain a healthy weight. Medicines  Take over-the-counter and prescription medicines only as told by your health care provider.  Take a multivitamin, if told by your health care provider.  Do not use herbal or dietary supplements unless they are approved by your health care provider. Activity   Exercise regularly, as told by your health care provider.  Use or practice techniques to help you relax, such as yoga, tai chi, meditation, or massage therapy. Eating and drinking   Avoid heavy meals in the evening.  Eat a well-balanced diet, which includes lean proteins, whole grains, plenty of fruits and vegetables, and low-fat dairy products.  Avoid consuming too much caffeine.  Avoid the use of alcohol.  Drink enough fluid to keep your urine pale yellow. Lifestyle  Change situations that cause you stress. Try to keep your work and personal schedule in balance.  Do not use any products that contain nicotine or tobacco, such as cigarettes and e-cigarettes. If you need help quitting, ask your health care provider.  Do not use drugs. Contact a health care provider if:  Your fatigue  does not get better.  You have a fever.  You suddenly lose or gain weight.  You have headaches.  You have trouble falling asleep or sleeping through the night.  You feel angry, guilty, anxious, or sad.  You are unable to have a bowel movement (constipation).  Your skin is dry.  You have swelling in your legs or another part of your body. Get help right away if:  You feel confused.  Your vision is blurry.  You feel faint or you pass out.  You have a severe headache.  You have severe pain in your abdomen, your back, or the area between your waist and hips (pelvis).  You have chest pain, shortness of breath, or an irregular or fast heartbeat.  You are unable to urinate, or you urinate less than normal.  You have abnormal bleeding, such as bleeding from the rectum, vagina, nose, lungs, or nipples.  You vomit blood.  You have thoughts about hurting yourself or others. If you ever feel like you may hurt yourself or others, or have thoughts about taking your own life, get help right away. You can go to your nearest emergency department or call:  Your local emergency services (911 in the [place]  A suicide crisis helpline, such as the [facility] at (919)767-3721. This is open 24 hours a day. Summary  If you have fatigue, you feel tired all the time and have a lack of energy or a lack of motivation.  Fatigue may make it difficult to start  or complete tasks because of exhaustion.  Long-lasting (chronic) or extreme fatigue may be a symptom of a medical condition.  Exercise regularly, as told by your health care provider.  Change situations that cause you stress. Try to keep your work and personal schedule in balance. This information is not intended to replace advice given to you by your health care provider. Make sure you discuss any questions you have with your health care provider. Document Released: 07/29/2007 Document Revised: 01/22/2019  Document Reviewed: 06/26/2017 Elsevier Patient Education  2020 Elsevier Inc[employer]  

## 2019-05-13 NOTE — Progress Notes (Signed)
Jonathan Baker is a 58 y.o. male who presents to [facility] today for dizzy episodes fatigue insomnia.  Patient notes over the last few days he is having some episodes of feeling little bit dizzy when he stands up.  He is feeling a little fatigued and sometimes feels little jittery.  He denies a room spinning sensation.  He denies any syncope.  He has been eating and drinking normally and producing urine normally.  No chest pain palpitation shortness of breath.  Additionally he notes he is having some trouble with insomnia.  His typical sleep schedule is to fall asleep at about 3 AM and get up at about noon or 1 PM and recently over the last few days he is only been able to fall asleep at about 5 or 6 AM and still waking up at 1 PM.  He has this is obviously making him feel tired.  He denies problems with insomnia previously.    ROS as above:  Exam:  BP 118/65   Pulse 67   Temp 98.4 F (36.9 C) (Oral)   Wt 167 lb (75.8 kg)  Wt Readings from Last 5 Encounters:  05/13/19 167 lb (75.8 kg)  04/30/18 176 lb (79.8 kg)  04/16/18 175 lb (79.4 kg)  07/16/16 171 lb (77.6 kg)   Orthostatic VS for the past 24 hrs:  BP- Lying Pulse- Lying BP- Sitting Pulse- Sitting BP- Standing at 0 minutes Pulse- Standing at 0 minutes  05/13/19 1441 131/80 66 132/82 68 136/87 76     Gen: Well NAD nontoxic appearing HEENT: EOMI,  MMM Lungs: Normal work of breathing. CTABL Heart: RRR no MRG Abd: NABS, Soft. Nondistended, Nontender Exts: Brisk capillary refill, warm and well perfused.  Neuro alert and oriented normal coordination balance and gait.  Negative Dix-Hallpike test bilaterally.    Assessment and Plan: 58 y.o. male with dizziness and fatigue.  Etiology currently is somewhat unclear.  Vital signs are reassuring and orthostatic vital signs are normal.  Plan for limited metabolic  work-up listed below including CBC metabolic panel and sed rate.  Will also check TSH.  Patient does have a history of BPH managed with Flomax.  Last PSA was a few years ago.  We will go ahead and check PSA with  labs today.  Insomnia: Unclear etiology.  Patient certainly has an abnormal sleep schedule however that is been stable for quite a while now.  Discussed options.  Plan for sleep hygiene.  If not improving will prescribe a little bit of trazodone for use intermittently as needed.  PDMP not reviewed this encounter. Orders Placed This Encounter  Procedures  . CBC with Differential/Platelet  . COMPLETE METABOLIC PANEL WITH GFR  . Sedimentation rate  . TSH  . PSA   Meds ordered this encounter  Medications  . traZODone (DESYREL) 50 MG tablet    Sig: Take 0.5-1 tablets (25-50 mg total) by mouth at bedtime as needed for sleep.    Dispense:  30 tablet    Refill:  3     Historical information moved to improve visibility of documentation.  Past Medical History:  Diagnosis Date  . Crohn disease ([place])   . Diverticulitis   . GERD (gastroesophageal reflux disease)   . IBS (irritable bowel syndrome)    Past Surgical History:  Procedure Laterality Date  . ANKLE SURGERY    . NASAL SEPTUM SURGERY    . SHOULDER SURGERY  Social History   Tobacco Use  . Smoking status: Current Every Day Smoker  . Smokeless tobacco: Never Used  Substance Use Topics  . Alcohol use: Yes   family history is not on file.  Medications: Current Outpatient Medications  Medication Sig Dispense Refill  . Cholecalciferol (VITAMIN D3) 1000 units CAPS Take by mouth.    . Multiple Vitamins-Minerals (CENTRUM ADULTS PO) Take 1 tablet by mouth.    . tamsulosin (FLOMAX) 0.4 MG CAPS capsule Take 1 capsule (0.4 mg total) by mouth daily. Pt needs to schedule appt. Before next refill 90 capsule 0  . traZODone (DESYREL) 50 MG tablet Take 0.5-1 tablets (25-50 mg total) by mouth at bedtime as needed for sleep. 30  tablet 3   No current facility-administered medications for this visit.    Allergies  Allergen Reactions  . Tetanus Toxoid Adsorbed Other (See Comments)    Pt states his whole body gets stiff.     Discussed warning signs or symptoms. Please see discharge instructions. Patient expresses understanding.

## 2019-05-14 LAB — CBC WITH DIFFERENTIAL/PLATELET
Absolute Monocytes: 678 cells/uL (ref 200–950)
Basophils Absolute: 19 cells/uL (ref 0–200)
Basophils Relative: 0.3 %
Eosinophils Absolute: 90 cells/uL (ref 15–500)
Eosinophils Relative: 1.4 %
HCT: 41.5 % (ref 38.5–50.0)
Hemoglobin: 14.3 g/dL (ref 13.2–17.1)
Lymphs Abs: 1581 cells/uL (ref 850–3900)
MCH: 32.3 pg (ref 27.0–33.0)
MCHC: 34.5 g/dL (ref 32.0–36.0)
MCV: 93.7 fL (ref 80.0–100.0)
MPV: 10.5 fL (ref 7.5–12.5)
Monocytes Relative: 10.6 %
Neutro Abs: 4032 cells/uL (ref 1500–7800)
Neutrophils Relative %: 63 %
Platelets: 308 10*3/uL (ref 140–400)
RBC: 4.43 10*6/uL (ref 4.20–5.80)
RDW: 12 % (ref 11.0–15.0)
Total Lymphocyte: 24.7 %
WBC: 6.4 10*3/uL (ref 3.8–10.8)

## 2019-05-14 LAB — COMPLETE METABOLIC PANEL WITH GFR
AG Ratio: 2 (calc) (ref 1.0–2.5)
ALT: 12 U/L (ref 9–46)
AST: 15 U/L (ref 10–35)
Albumin: 4.5 g/dL (ref 3.6–5.1)
Alkaline phosphatase (APISO): 80 U/L (ref 35–144)
BUN/Creatinine Ratio: 14 (calc) (ref 6–22)
BUN: 10 mg/dL (ref 7–25)
CO2: 31 mmol/L (ref 20–32)
Calcium: 10.2 mg/dL (ref 8.6–10.3)
Chloride: 103 mmol/L (ref 98–110)
Creat: 0.69 mg/dL — ABNORMAL LOW (ref 0.70–1.33)
GFR, Est African American: 122 mL/min/{1.73_m2} (ref >=60)
GFR, Est Non African American: 105 mL/min/{1.73_m2} (ref >=60)
Globulin: 2.3 g/dL (calc) (ref 1.9–3.7)
Glucose, Bld: 80 mg/dL (ref 65–99)
Potassium: 4.5 mmol/L (ref 3.5–5.3)
Sodium: 139 mmol/L (ref 135–146)
Total Bilirubin: 0.7 mg/dL (ref 0.2–1.2)
Total Protein: 6.8 g/dL (ref 6.1–8.1)

## 2019-05-14 LAB — SEDIMENTATION RATE: Sed Rate: 22 mm/h — ABNORMAL HIGH (ref 0–20)

## 2019-05-14 LAB — TSH: TSH: 0.82 mIU/L (ref 0.40–4.50)

## 2019-05-14 LAB — PSA: PSA: 0.6 ng/mL (ref <=4.0)

## 2019-07-01 ENCOUNTER — Ambulatory Visit: Payer: BC Managed Care – PPO | Admitting: Family Medicine

## 2019-07-01 ENCOUNTER — Other Ambulatory Visit: Payer: Self-pay

## 2019-07-01 ENCOUNTER — Encounter: Payer: Self-pay | Admitting: Family Medicine

## 2019-07-01 VITALS — BP 147/78 | HR 65 | Temp 98.0°F | Wt 167.0 lb

## 2019-07-01 DIAGNOSIS — N63 Unspecified lump in unspecified breast: Secondary | ICD-10-CM

## 2019-07-01 DIAGNOSIS — N611 Abscess of the breast and nipple: Secondary | ICD-10-CM | POA: Diagnosis not present

## 2019-07-01 MED ORDER — DOXYCYCLINE HYCLATE 100 MG PO TABS: 100.0000 mg | ORAL_TABLET | Freq: Two times a day (BID) | ORAL | 0 refills | Status: DC

## 2019-07-01 NOTE — Progress Notes (Signed)
Jonathan Baker is a 58 y.o. male who presents to [facility] [place] today for right breast abscess and mass.  Patient has had a subcutaneous mass under or around his right nipple for months.  It is been mobile and occasionally tender.  Occasionally he was able to express a milky substance that was not foul-smelling.  He notes that over the last week or so it is become red painful and swollen and is worried he may have an abscess.  He denies any fevers or chills nausea vomiting or diarrhea.  He thinks in the past he had an ultrasound in [place] state of this which was nondiagnostic.   ROS as above:  Exam:  BP (!) 147/78   Pulse 65   Temp 98 F (36.7 C) (Oral)   Wt 167 lb (75.8 kg)  Wt Readings from Last 5 Encounters:  07/01/19 167 lb (75.8 kg)  05/13/19 167 lb (75.8 kg)  04/30/18 176 lb (79.8 kg)  04/16/18 175 lb (79.4 kg)  07/16/16 171 lb (77.6 kg)    Gen: Well NAD HEENT: EOMI,  MMM Lungs: Normal work of breathing. CTABL Heart: RRR no MRG Abd: NABS, Soft. Nondistended, Nontender Exts: Brisk capillary refill, warm and well perfused.  Right nipple erythematous swollen fluctuant tender mass nipple 2 o'clock position.  No fluid is expressible..  Abscess incision and drainage. Consent obtained and timeout performed. Skin cleaned with alcohol, and cold spray applied. To mL of lidocaine with epinephrine injected achieving good anesthesia. Skin was again cleaned with alcohol. A sharp incision was made to the area of fluctuance. The incision was widened and pus was expressed. Pus was cultured. Blunt dissection was used to break up loculations. Further pus was expressed. Patient tolerated the procedure well. A dressing was applied    Assessment and Plan: 58 y.o. male with right breast mass with secondary abscess.  Abscess incised and drained today.  Contents appear to be  typical purulence.  Culture pending.  However patient has pre-existing nipple mass that seems to be associated with the potential milk ducts.  Plan for diagnostic mammogram and ultrasound.  He lives in [place] so we will proceed with [facility] location.  Recheck if not improving.  Return sooner near future.  We will go ahead and also prescribe doxycycline antibiotics for potential cellulitis surrounding abscess.  PDMP not reviewed this encounter. Orders Placed This Encounter  Procedures  . Wound culture    Order Specific Question:   Source    Answer:   abscess nipple right  . MM Digital Diagnostic Unilat R    Standing Status:   Future    Standing Expiration Date:   08/30/2020    Scheduling Instructions:     [facility]    Order Specific Question:   Reason for Exam (SYMPTOM  OR DIAGNOSIS REQUIRED)    Answer:   Eval mass/cyst nipple    Order Specific Question:   Preferred imaging location?    Answer:   External  . US BREAST COMPLETE UNI RIGHT INC AXILLA    Standing Status:   Future    Standing Expiration Date:   08/30/2020    Scheduling Instructions:     [facility] with diagnostic mammogram    Order Specific Question:   Reason for Exam (SYMPTOM  OR DIAGNOSIS REQUIRED)    Answer:   eval cyst /mass right nipple    Order Specific Question:   Preferred imaging location?  Answer:   External   Meds ordered this encounter  Medications  . doxycycline (VIBRA-TABS) 100 MG tablet    Sig: Take 1 tablet (100 mg total) by mouth 2 (two) times daily.    Dispense:  14 tablet    Refill:  0     Historical information moved to improve visibility of documentation.  Past Medical History:  Diagnosis Date  . Crohn disease ([place])   . Diverticulitis   . GERD (gastroesophageal reflux disease)   . IBS (irritable bowel syndrome)    Past Surgical History:  Procedure Laterality Date  . ANKLE SURGERY    . NASAL SEPTUM SURGERY    . SHOULDER SURGERY     Social History    Tobacco Use  . Smoking status: Current Every Day Smoker  . Smokeless tobacco: Never Used  Substance Use Topics  . Alcohol use: Yes   family history is not on file.  Medications: Current Outpatient Medications  Medication Sig Dispense Refill  . Cholecalciferol (VITAMIN D3) 1000 units CAPS Take by mouth.    . Multiple Vitamins-Minerals (CENTRUM ADULTS PO) Take 1 tablet by mouth.    . tamsulosin (FLOMAX) 0.4 MG CAPS capsule Take 1 capsule (0.4 mg total) by mouth daily. Pt needs to schedule appt. Before next refill 90 capsule 0  . traZODone (DESYREL) 50 MG tablet Take 0.5-1 tablets (25-50 mg total) by mouth at bedtime as needed for sleep. 30 tablet 3  . doxycycline (VIBRA-TABS) 100 MG tablet Take 1 tablet (100 mg total) by mouth 2 (two) times daily. 14 tablet 0   No current facility-administered medications for this visit.    Allergies  Allergen Reactions  . Tetanus Toxoid Adsorbed Other (See Comments)    Pt states his whole body gets stiff.     Discussed warning signs or symptoms. Please see discharge instructions. Patient expresses understanding.

## 2019-07-01 NOTE — Patient Instructions (Signed)
Thank you for coming in today. Keep the wound covered with ointment until healed.  Take doxycycline antibiotics.  You should also hear about diagnostic mammogram and ultrasound soon Let me know if you do not hear anything.  We will try to use the [facility] location.    Skin Abscess  A skin abscess is an infected area of your skin that contains pus and other material. An abscess can happen in any part of your body. Some abscesses break open (rupture) on their own. Most continue to get worse unless they are treated. The infection can spread deeper into the body and into your blood, which can make you feel sick. A skin abscess is caused by germs that enter the skin through a cut or scrape. It can also be caused by blocked oil and sweat glands or infected hair follicles. This condition is usually treated by:  Draining the pus.  Taking antibiotic medicines.  Placing a warm, wet washcloth over the abscess. Follow these instructions at home: Medicines   Take over-the-counter and prescription medicines only as told by your doctor.  If you were prescribed an antibiotic medicine, take it as told by your doctor. Do not stop taking the antibiotic even if you start to feel better. Abscess care   If you have an abscess that has not drained, place a warm, clean, wet washcloth over the abscess several times a day. Do this as told by your doctor.  Follow instructions from your doctor about how to take care of your abscess. Make sure you: ? Cover the abscess with a bandage (dressing). ? Change your bandage or gauze as told by your doctor. ? Wash your hands with soap and water before you change the bandage or gauze. If you cannot use soap and water, use hand sanitizer.  Check your abscess every day for signs that the infection is getting worse. Check for: ? More redness, swelling, or pain. ? More fluid or blood. ? Warmth. ? More pus or a bad smell. General instructions  To avoid spreading the  infection: ? Do not share personal care items, towels, or hot tubs with others. ? Avoid making skin-to-skin contact with other people.  Keep all follow-up visits as told by your doctor. This is important. Contact a doctor if:  You have more redness, swelling, or pain around your abscess.  You have more fluid or blood coming from your abscess.  Your abscess feels warm when you touch it.  You have more pus or a bad smell coming from your abscess.  You have a fever.  Your muscles ache.  You have chills.  You feel sick. Get help right away if:  You have very bad (severe) pain.  You see red streaks on your skin spreading away from the abscess. Summary  A skin abscess is an infected area of your skin that contains pus and other material.  The abscess is caused by germs that enter the skin through a cut or scrape. It can also be caused by blocked oil and sweat glands or infected hair follicles.  Follow your doctor's instructions on caring for your abscess, taking medicines, preventing infections, and keeping follow-up visits. This information is not intended to replace advice given to you by your health care provider. Make sure you discuss any questions you have with your health care provider. Document Released: 03/19/2008 Document Revised: 01/22/2019 Document Reviewed: 11/14/2017 Elsevier Patient Education  2020 [employer].

## 2019-07-04 LAB — WOUND CULTURE
GRAM STAIN:: NONE SEEN
MICRO NUMBER:: 888784
RESULT:: NO GROWTH
SPECIMEN QUALITY:: ADEQUATE

## 2019-07-17 DIAGNOSIS — Z9289 Personal history of other medical treatment: Secondary | ICD-10-CM | POA: Diagnosis not present

## 2019-07-17 DIAGNOSIS — N62 Hypertrophy of breast: Secondary | ICD-10-CM | POA: Diagnosis not present

## 2019-07-20 ENCOUNTER — Telehealth: Payer: Self-pay | Admitting: Family Medicine

## 2019-07-20 NOTE — Telephone Encounter (Signed)
Pt notified of results -EH/RMA   

## 2019-07-20 NOTE — Telephone Encounter (Signed)
Received diagnostic mammogram report from [facility].  No significant abnormality seen.  This is great news.

## 2019-09-02 ENCOUNTER — Telehealth: Payer: Self-pay

## 2019-09-02 ENCOUNTER — Other Ambulatory Visit: Payer: Self-pay

## 2019-09-02 DIAGNOSIS — N400 Enlarged prostate without lower urinary tract symptoms: Secondary | ICD-10-CM

## 2019-09-02 MED ORDER — TAMSULOSIN HCL 0.4 MG PO CAPS: 0.4000 mg | ORAL_CAPSULE | Freq: Every day | ORAL | 0 refills | Status: DC

## 2019-09-02 NOTE — Telephone Encounter (Signed)
Sounds as though his BPH is well controlled with Flomax, he was recently seen for this back in July, I am happy to refill it.  He needs to find another PCP, this will be the last refill.

## 2019-09-02 NOTE — Telephone Encounter (Signed)
[facility] pharmacy requesting med refill for tamsulosin. As per notes on last rx, pt was to make an appt before next refill. Pls advise, if refill is appropriate. Thanks.

## 2019-09-04 ENCOUNTER — Ambulatory Visit: Payer: BC Managed Care – PPO | Admitting: Physician Assistant

## 2019-12-14 ENCOUNTER — Other Ambulatory Visit: Payer: Self-pay | Admitting: Family Medicine

## 2019-12-14 NOTE — Telephone Encounter (Signed)
Please advise on refill. Does he need a follow up?

## 2019-12-15 ENCOUNTER — Other Ambulatory Visit: Payer: Self-pay | Admitting: *Deleted

## 2019-12-15 DIAGNOSIS — N400 Enlarged prostate without lower urinary tract symptoms: Secondary | ICD-10-CM

## 2019-12-15 MED ORDER — TAMSULOSIN HCL 0.4 MG PO CAPS: 0.4000 mg | ORAL_CAPSULE | Freq: Every day | ORAL | 0 refills | Status: DC

## 2020-01-13 ENCOUNTER — Telehealth: Payer: Self-pay | Admitting: General Practice

## 2020-01-13 NOTE — Telephone Encounter (Signed)
Appointment scheduled on 01/25/20.   Please advise.

## 2020-01-13 NOTE — Telephone Encounter (Signed)
Received a refill for Trazadone. Per Dr. Zigmund Daniel he will need an appointment before he can get further refills. Please advise.

## 2020-01-25 ENCOUNTER — Ambulatory Visit: Payer: BC Managed Care – PPO | Admitting: Family Medicine

## 2020-01-27 ENCOUNTER — Other Ambulatory Visit: Payer: Self-pay

## 2020-01-27 ENCOUNTER — Ambulatory Visit: Payer: BC Managed Care – PPO | Admitting: Family Medicine

## 2020-01-27 ENCOUNTER — Encounter: Payer: Self-pay | Admitting: Family Medicine

## 2020-01-27 DIAGNOSIS — R61 Generalized hyperhidrosis: Secondary | ICD-10-CM | POA: Diagnosis not present

## 2020-01-27 DIAGNOSIS — F5101 Primary insomnia: Secondary | ICD-10-CM

## 2020-01-27 DIAGNOSIS — G4700 Insomnia, unspecified: Secondary | ICD-10-CM | POA: Insufficient documentation

## 2020-01-27 DIAGNOSIS — N401 Enlarged prostate with lower urinary tract symptoms: Secondary | ICD-10-CM | POA: Diagnosis not present

## 2020-01-27 DIAGNOSIS — J340 Abscess, furuncle and carbuncle of nose: Secondary | ICD-10-CM | POA: Diagnosis not present

## 2020-01-27 DIAGNOSIS — R351 Nocturia: Secondary | ICD-10-CM

## 2020-01-27 MED ORDER — DRYSOL 20 % EX SOLN: Freq: Every day | CUTANEOUS | 2 refills | Status: DC

## 2020-01-27 MED ORDER — MUPIROCIN 2 % EX OINT: 1.0000 "application " | TOPICAL_OINTMENT | Freq: Two times a day (BID) | CUTANEOUS | 0 refills | Status: AC

## 2020-01-27 MED ORDER — IPRATROPIUM BROMIDE 0.03 % NA SOLN: 2.0000 | Freq: Two times a day (BID) | NASAL | 12 refills | Status: DC

## 2020-01-27 NOTE — Progress Notes (Signed)
Jonathan Baker - 59 y.o. male MRN [id number]  Date of birth: [birth year]  Subjective Chief Complaint  Patient presents with  . Follow-up    HPI Jonathan Baker is a 59 y.o. male with history of BPH, chronic insomnia, and history of crohn's disease here today for follow up visit.   He also reports problems with hyperhidrosis and recurrent nasal congestion and irritation.   He reports that chronic conditions are stable.  BPH symptoms are well controlled with tamsulosin.    Insomnia is well controlled with trazodone.  No significant side effects from this.    -Hyperhidrosis:  Reports having this for several years. Has not tried antiperspirants due to residue in underarms.  Mostly in underarm area He has never been prescribed anything for treatment of this.  Denies increased facial sweating or palm sweating.   -Nasal irritation:  Reports chronic nasal congestion, rhinorrhea and irritation.  Has recurrent nose bleeds.  He has had prior sinus surgery for deviated septum but reports that this was not very helpful.  He reports trying several OTC medications and nasal sprays without significant improvement.    ROS:  A comprehensive ROS was completed and negative except as noted per HPI   - Allergies  Allergen Reactions  . Tetanus Toxoid Adsorbed Other (See Comments)    Pt states his whole body gets stiff.    Past Medical History:  Diagnosis Date  . Crohn disease ([place])   . Diverticulitis   . GERD (gastroesophageal reflux disease)   . IBS (irritable bowel syndrome)     Past Surgical History:  Procedure Laterality Date  . ANKLE SURGERY    . NASAL SEPTUM SURGERY    . SHOULDER SURGERY      Social History   Socioeconomic History  . Marital status: Married    Spouse name: Not on file  . Number of children: Not on file  . Years of education: Not on file  . Highest education level: Not on file  Occupational History  . Not on file  Tobacco Use  . Smoking status: Current Every Day Smoker   . Smokeless tobacco: Never Used  Substance and Sexual Activity  . Alcohol use: Yes  . Drug use: Yes    Types: Marijuana  . Sexual activity: Yes  Other Topics Concern  . Not on file  Social History Narrative  . Not on file   Social Determinants of Health   Financial Resource Strain:   . Difficulty of Paying Living Expenses:   Food Insecurity:   . Worried About Charity fundraiser in the Last Year:   . Arboriculturist in the Last Year:   Transportation Needs:   . Film/video editor (Medical):   Marland Kitchen Lack of Transportation (Non-Medical):   Physical Activity:   . Days of Exercise per Week:   . Minutes of Exercise per Session:   Stress:   . Feeling of Stress :   Social Connections:   . Frequency of Communication with Friends and Family:   . Frequency of Social Gatherings with Friends and Family:   . Attends Religious Services:   . Active Member of Clubs or Organizations:   . Attends Archivist Meetings:   Marland Kitchen Marital Status:     No family history on file.  Health Maintenance  Topic Date Due  . TETANUS/TDAP  Never done  . COLONOSCOPY  Never done  . INFLUENZA VACCINE  05/15/2020  . Hepatitis C Screening  Completed  .  HIV Screening  Completed     ----------------------------------------------------------------------------------------------------------------------------------------------------------------------------------------------------------------- Physical Exam There were no vitals taken for this visit.  Physical Exam Constitutional:      Appearance: Normal appearance.  HENT:     Head: Normocephalic and atraumatic.     Mouth/Throat:     Comments: R nare with dried blood and small ulceration along septal area.   Eyes:     General: No scleral icterus. Cardiovascular:     Rate and Rhythm: Normal rate and regular rhythm.  Pulmonary:     Effort: Pulmonary effort is normal.     Breath sounds: Normal breath sounds.  Skin:    General: Skin is warm and  dry.  Neurological:     Mental Status: He is alert.  Psychiatric:        Mood and Affect: Mood normal.        Behavior: Behavior normal.     ------------------------------------------------------------------------------------------------------------------------------------------------------------------------------------------------------------------- Assessment and Plan  BPH (benign prostatic hyperplasia) LUT"s well controlled with flomax, continue at current dose.    Insomnia Well managed with trazodone, continue at current strength.  Hyperhidrosis Discussed trial of drysol.   Reviewed potential side effects including skin irritation.   He will let me know if not effective or if not tolerating well.   Nasal septum ulceration Avoid any steroid containing nasal sprays for now.  Start mupirocin bid x7 days. Add atrovent spray for chronic rhinorrhea and congestion.    Meds ordered this encounter  Medications  . aluminum chloride (DRYSOL) 20 % external solution    Sig: Apply topically at bedtime.    Dispense:  35 mL    Refill:  2  . mupirocin ointment (BACTROBAN) 2 %    Sig: Place 1 application into the nose 2 (two) times daily for 7 days.    Dispense:  22 g    Refill:  0  . ipratropium (ATROVENT) 0.03 % nasal spray    Sig: Place 2 sprays into both nostrils every 12 (twelve) hours.    Dispense:  30 mL    Refill:  12    No follow-ups on file.    This visit occurred during the SARS-CoV-2 public health emergency.  Safety protocols were in place, including screening questions prior to the visit, additional usage of staff PPE, and extensive cleaning of exam room while observing appropriate contact time as indicated for disinfecting solutions.

## 2020-01-27 NOTE — Assessment & Plan Note (Signed)
LUT"s well controlled with flomax, continue at current dose.

## 2020-01-27 NOTE — Assessment & Plan Note (Signed)
Avoid any steroid containing nasal sprays for now.  Start mupirocin bid x7 days. Add atrovent spray for chronic rhinorrhea and congestion.

## 2020-01-27 NOTE — Assessment & Plan Note (Signed)
Well managed with trazodone, continue at current strength.

## 2020-01-27 NOTE — Assessment & Plan Note (Signed)
Discussed trial of drysol.   Reviewed potential side effects including skin irritation.   He will let me know if not effective or if not tolerating well.

## 2020-01-27 NOTE — Patient Instructions (Addendum)
Try the drysol at bedtime.  If too irritating let me know.  Use antibiotic ointment in nose twice daily for a week Try the atrovent nasal spray twice per day.  Let me know if not helpful.

## 2020-03-17 ENCOUNTER — Other Ambulatory Visit: Payer: Self-pay | Admitting: Family Medicine

## 2020-03-17 DIAGNOSIS — N400 Enlarged prostate without lower urinary tract symptoms: Secondary | ICD-10-CM

## 2020-03-17 MED ORDER — TAMSULOSIN HCL 0.4 MG PO CAPS: 0.4000 mg | ORAL_CAPSULE | Freq: Every day | ORAL | 2 refills | Status: DC

## 2020-07-01 ENCOUNTER — Encounter: Payer: Self-pay | Admitting: Physician Assistant

## 2020-07-01 ENCOUNTER — Ambulatory Visit: Payer: BC Managed Care – PPO | Admitting: Physician Assistant

## 2020-07-01 VITALS — BP 131/84 | HR 79 | Wt 167.0 lb

## 2020-07-01 DIAGNOSIS — R319 Hematuria, unspecified: Secondary | ICD-10-CM

## 2020-07-01 DIAGNOSIS — Z87442 Personal history of urinary calculi: Secondary | ICD-10-CM | POA: Diagnosis not present

## 2020-07-01 DIAGNOSIS — Q613 Polycystic kidney, unspecified: Secondary | ICD-10-CM

## 2020-07-01 DIAGNOSIS — Z8419 Family history of other disorders of kidney and ureter: Secondary | ICD-10-CM | POA: Insufficient documentation

## 2020-07-01 DIAGNOSIS — R109 Unspecified abdominal pain: Secondary | ICD-10-CM | POA: Diagnosis not present

## 2020-07-01 DIAGNOSIS — M545 Low back pain, unspecified: Secondary | ICD-10-CM

## 2020-07-01 DIAGNOSIS — Z841 Family history of disorders of kidney and ureter: Secondary | ICD-10-CM | POA: Insufficient documentation

## 2020-07-01 LAB — POCT URINALYSIS DIP (CLINITEK)
Bilirubin, UA: NEGATIVE
Glucose, UA: NEGATIVE mg/dL
Ketones, POC UA: NEGATIVE mg/dL
Leukocytes, UA: NEGATIVE
Nitrite, UA: NEGATIVE
POC PROTEIN,UA: NEGATIVE
Spec Grav, UA: 1.01 (ref 1.010–1.025)
Urobilinogen, UA: 0.2 E.U./dL
pH, UA: 7 (ref 5.0–8.0)

## 2020-07-01 MED ORDER — PREDNISONE 50 MG PO TABS: ORAL_TABLET | ORAL | 0 refills | Status: DC

## 2020-07-01 MED ORDER — KETOROLAC TROMETHAMINE 60 MG/2ML IM SOLN
60.0000 mg | Freq: Once | INTRAMUSCULAR | Status: AC
  Administered 2020-07-01: 60 mg via INTRAMUSCULAR

## 2020-07-01 MED ORDER — CYCLOBENZAPRINE HCL 10 MG PO TABS: 10.0000 mg | ORAL_TABLET | Freq: Three times a day (TID) | ORAL | 0 refills | Status: DC | PRN

## 2020-07-01 MED ORDER — TRAMADOL HCL 50 MG PO TABS: 50.0000 mg | ORAL_TABLET | Freq: Four times a day (QID) | ORAL | 0 refills | Status: AC | PRN

## 2020-07-01 NOTE — Patient Instructions (Addendum)
toradol in office.  Prednisone for 5 days.  Flexeril as needed.  Icy hot patches can help.  xrays ordered.  Tramadol for breakthrough pain  Low Back Sprain or Strain Rehab Ask your health care provider which exercises are safe for you. Do exercises exactly as told by your health care provider and adjust them as directed. It is normal to feel mild stretching, pulling, tightness, or discomfort as you do these exercises. Stop right away if you feel sudden pain or your pain gets worse. Do not begin these exercises until told by your health care provider. Stretching and range-of-motion exercises These exercises warm up your muscles and joints and improve the movement and flexibility of your back. These exercises also help to relieve pain, numbness, and tingling. Lumbar rotation  1. Lie on your back on a firm surface and bend your knees. 2. Straighten your arms out to your sides so each arm forms a 90-degree angle (right angle) with a side of your body. 3. Slowly move (rotate) both of your knees to one side of your body until you feel a stretch in your lower back (lumbar). Try not to let your shoulders lift off the floor. 4. Hold this position for __________ seconds. 5. Tense your abdominal muscles and slowly move your knees back to the starting position. 6. Repeat this exercise on the other side of your body. Repeat __________ times. Complete this exercise __________ times a day. Single knee to chest  1. Lie on your back on a firm surface with both legs straight. 2. Bend one of your knees. Use your hands to move your knee up toward your chest until you feel a gentle stretch in your lower back and buttock. ? Hold your leg in this position by holding on to the front of your knee. ? Keep your other leg as straight as possible. 3. Hold this position for __________ seconds. 4. Slowly return to the starting position. 5. Repeat with your other leg. Repeat __________ times. Complete this exercise  __________ times a day. Prone extension on elbows  1. Lie on your abdomen on a firm surface (prone position). 2. Prop yourself up on your elbows. 3. Use your arms to help lift your chest up until you feel a gentle stretch in your abdomen and your lower back. ? This will place some of your body weight on your elbows. If this is uncomfortable, try stacking pillows under your chest. ? Your hips should stay down, against the surface that you are lying on. Keep your hip and back muscles relaxed. 4. Hold this position for __________ seconds. 5. Slowly relax your upper body and return to the starting position. Repeat __________ times. Complete this exercise __________ times a day. Strengthening exercises These exercises build strength and endurance in your back. Endurance is the ability to use your muscles for a long time, even after they get tired. Pelvic tilt This exercise strengthens the muscles that lie deep in the abdomen. 1. Lie on your back on a firm surface. Bend your knees and keep your feet flat on the floor. 2. Tense your abdominal muscles. Tip your pelvis up toward the ceiling and flatten your lower back into the floor. ? To help with this exercise, you may place a small towel under your lower back and try to push your back into the towel. 3. Hold this position for __________ seconds. 4. Let your muscles relax completely before you repeat this exercise. Repeat __________ times. Complete this exercise __________ times  a day. Alternating arm and leg raises  1. Get on your hands and knees on a firm surface. If you are on a hard floor, you may want to use padding, such as an exercise mat, to cushion your knees. 2. Line up your arms and legs. Your hands should be directly below your shoulders, and your knees should be directly below your hips. 3. Lift your left leg behind you. At the same time, raise your right arm and straighten it in front of you. ? Do not lift your leg higher than your  hip. ? Do not lift your arm higher than your shoulder. ? Keep your abdominal and back muscles tight. ? Keep your hips facing the ground. ? Do not arch your back. ? Keep your balance carefully, and do not hold your breath. 4. Hold this position for __________ seconds. 5. Slowly return to the starting position. 6. Repeat with your right leg and your left arm. Repeat __________ times. Complete this exercise __________ times a day. Abdominal set with straight leg raise  1. Lie on your back on a firm surface. 2. Bend one of your knees and keep your other leg straight. 3. Tense your abdominal muscles and lift your straight leg up, 4-6 inches (10-15 cm) off the ground. 4. Keep your abdominal muscles tight and hold this position for __________ seconds. ? Do not hold your breath. ? Do not arch your back. Keep it flat against the ground. 5. Keep your abdominal muscles tense as you slowly lower your leg back to the starting position. 6. Repeat with your other leg. Repeat __________ times. Complete this exercise __________ times a day. Single leg lower with bent knees 1. Lie on your back on a firm surface. 2. Tense your abdominal muscles and lift your feet off the floor, one foot at a time, so your knees and hips are bent in 90-degree angles (right angles). ? Your knees should be over your hips and your lower legs should be parallel to the floor. 3. Keeping your abdominal muscles tense and your knee bent, slowly lower one of your legs so your toe touches the ground. 4. Lift your leg back up to return to the starting position. ? Do not hold your breath. ? Do not let your back arch. Keep your back flat against the ground. 5. Repeat with your other leg. Repeat __________ times. Complete this exercise __________ times a day. Posture and body mechanics Good posture and healthy body mechanics can help to relieve stress in your body's tissues and joints. Body mechanics refers to the movements and  positions of your body while you do your daily activities. Posture is part of body mechanics. Good posture means:  Your spine is in its natural S-curve position (neutral).  Your shoulders are pulled back slightly.  Your head is not tipped forward. Follow these guidelines to improve your posture and body mechanics in your everyday activities. Standing   When standing, keep your spine neutral and your feet about hip width apart. Keep a slight bend in your knees. Your ears, shoulders, and hips should line up.  When you do a task in which you stand in one place for a long time, place one foot up on a stable object that is 2-4 inches (5-10 cm) high, such as a footstool. This helps keep your spine neutral. Sitting   When sitting, keep your spine neutral and keep your feet flat on the floor. Use a footrest, if necessary, and keep your  thighs parallel to the floor. Avoid rounding your shoulders, and avoid tilting your head forward.  When working at a desk or a computer, keep your desk at a height where your hands are slightly lower than your elbows. Slide your chair under your desk so you are close enough to maintain good posture.  When working at a computer, place your monitor at a height where you are looking straight ahead and you do not have to tilt your head forward or downward to look at the screen. Resting  When lying down and resting, avoid positions that are most painful for you.  If you have pain with activities such as sitting, bending, stooping, or squatting, lie in a position in which your body does not bend very much. For example, avoid curling up on your side with your arms and knees near your chest (fetal position).  If you have pain with activities such as standing for a long time or reaching with your arms, lie with your spine in a neutral position and bend your knees slightly. Try the following positions: ? Lying on your side with a pillow between your knees. ? Lying on your  back with a pillow under your knees. Lifting   When lifting objects, keep your feet at least shoulder width apart and tighten your abdominal muscles.  Bend your knees and hips and keep your spine neutral. It is important to lift using the strength of your legs, not your back. Do not lock your knees straight out.  Always ask for help to lift heavy or awkward objects. This information is not intended to replace advice given to you by your health care provider. Make sure you discuss any questions you have with your health care provider. Document Revised: 01/23/2019 Document Reviewed: 10/23/2018 Elsevier Patient Education  [place].

## 2020-07-01 NOTE — Progress Notes (Signed)
Please culture.
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
## 2020-07-03 LAB — URINE CULTURE
MICRO NUMBER:: 10966610
Result:: NO GROWTH
SPECIMEN QUALITY:: ADEQUATE

## 2020-07-04 DIAGNOSIS — M545 Low back pain, unspecified: Secondary | ICD-10-CM | POA: Insufficient documentation

## 2020-07-04 DIAGNOSIS — R109 Unspecified abdominal pain: Secondary | ICD-10-CM | POA: Insufficient documentation

## 2020-07-04 DIAGNOSIS — R319 Hematuria, unspecified: Secondary | ICD-10-CM | POA: Insufficient documentation

## 2020-07-04 NOTE — Progress Notes (Signed)
No growth of bacteria in urine culture. How are symptoms today?

## 2020-07-04 NOTE — Progress Notes (Signed)
[place] is to follow up on polycystic kidneys. I wanted him to get abdominal and lumbar xray to look for stones and degeneration of spine. Will he get these done?

## 2020-07-06 ENCOUNTER — Other Ambulatory Visit: Payer: Self-pay

## 2020-07-06 ENCOUNTER — Ambulatory Visit (INDEPENDENT_AMBULATORY_CARE_PROVIDER_SITE_OTHER): Payer: BC Managed Care – PPO

## 2020-07-06 DIAGNOSIS — Z87442 Personal history of urinary calculi: Secondary | ICD-10-CM | POA: Diagnosis not present

## 2020-07-06 DIAGNOSIS — Q613 Polycystic kidney, unspecified: Secondary | ICD-10-CM

## 2020-07-06 DIAGNOSIS — I878 Other specified disorders of veins: Secondary | ICD-10-CM | POA: Diagnosis not present

## 2020-07-06 DIAGNOSIS — R109 Unspecified abdominal pain: Secondary | ICD-10-CM

## 2020-07-06 DIAGNOSIS — M545 Low back pain: Secondary | ICD-10-CM | POA: Diagnosis not present

## 2020-07-07 ENCOUNTER — Encounter: Payer: Self-pay | Admitting: Physician Assistant

## 2020-07-07 ENCOUNTER — Other Ambulatory Visit: Payer: Self-pay | Admitting: Neurology

## 2020-07-07 DIAGNOSIS — M5136 Other intervertebral disc degeneration, lumbar region: Secondary | ICD-10-CM | POA: Insufficient documentation

## 2020-07-07 DIAGNOSIS — N281 Cyst of kidney, acquired: Secondary | ICD-10-CM

## 2020-07-07 DIAGNOSIS — N200 Calculus of kidney: Secondary | ICD-10-CM | POA: Insufficient documentation

## 2020-07-07 NOTE — Progress Notes (Signed)
Thank you.  signed

## 2020-07-07 NOTE — Progress Notes (Signed)
Looks like 99mm stone in left upper quadrant. Are you still having pain? You have the flomax you should be able to pass it but if not improving may need to consider other interventions.

## 2020-07-07 NOTE — Progress Notes (Signed)
Called and confirmed with radiology the correct order and ordered MR to evaluate kidneys. [place].

## 2020-07-07 NOTE — Progress Notes (Signed)
Call patient:   Lumbar xray shows arthritic changes in L1 and L2 but I don't think this is where you pain is coming from.

## 2020-07-07 NOTE — Progress Notes (Signed)
Call patient lots of cysts on both kidney. You do have one complex cyst that radiologist recommended MRI of kidney to confirm there is nothing solid inside the cyst it is also on the left side and perhaps could be causing your pain as well.

## 2020-07-18 ENCOUNTER — Other Ambulatory Visit: Payer: Self-pay

## 2020-07-18 ENCOUNTER — Ambulatory Visit: Payer: BC Managed Care – PPO

## 2020-07-18 DIAGNOSIS — D1803 Hemangioma of intra-abdominal structures: Secondary | ICD-10-CM | POA: Diagnosis not present

## 2020-07-18 DIAGNOSIS — K7689 Other specified diseases of liver: Secondary | ICD-10-CM | POA: Diagnosis not present

## 2020-07-18 DIAGNOSIS — Q613 Polycystic kidney, unspecified: Secondary | ICD-10-CM | POA: Diagnosis not present

## 2020-07-18 DIAGNOSIS — I700 Atherosclerosis of aorta: Secondary | ICD-10-CM | POA: Diagnosis not present

## 2020-07-18 DIAGNOSIS — N281 Cyst of kidney, acquired: Secondary | ICD-10-CM

## 2020-07-18 MED ORDER — GADOBUTROL 1 MMOL/ML IV SOLN
7.5000 mL | Freq: Once | INTRAVENOUS | Status: AC | PRN
  Administered 2020-07-18: 7.5 mL via INTRAVENOUS

## 2020-07-19 NOTE — Progress Notes (Signed)
Call pt:   You have complex cysts of both kidneys but not anything that looks like a malignancy.  You have a hemangioma on the liver but not concerning.  You do have evidence of plaque build up in aortic. This is indication to start a statin to lower CV risk. Would you be ok with this?

## 2020-07-20 MED ORDER — ATORVASTATIN CALCIUM 40 MG PO TABS: 40.0000 mg | ORAL_TABLET | Freq: Every day | ORAL | 3 refills | Status: DC

## 2020-07-20 NOTE — Progress Notes (Signed)
Sent lipitor

## 2020-11-18 ENCOUNTER — Other Ambulatory Visit: Payer: Self-pay | Admitting: Family Medicine

## 2020-11-18 DIAGNOSIS — N400 Enlarged prostate without lower urinary tract symptoms: Secondary | ICD-10-CM

## 2020-11-23 ENCOUNTER — Telehealth (INDEPENDENT_AMBULATORY_CARE_PROVIDER_SITE_OTHER): Payer: BC Managed Care – PPO | Admitting: Physician Assistant

## 2020-11-23 ENCOUNTER — Encounter: Payer: Self-pay | Admitting: Physician Assistant

## 2020-11-23 VITALS — Temp 97.7°F | Ht 72.0 in | Wt 167.0 lb

## 2020-11-23 DIAGNOSIS — R0602 Shortness of breath: Secondary | ICD-10-CM

## 2020-11-23 DIAGNOSIS — F17200 Nicotine dependence, unspecified, uncomplicated: Secondary | ICD-10-CM

## 2020-11-23 DIAGNOSIS — U071 COVID-19: Secondary | ICD-10-CM

## 2020-11-23 DIAGNOSIS — R059 Cough, unspecified: Secondary | ICD-10-CM | POA: Diagnosis not present

## 2020-11-23 MED ORDER — DEXAMETHASONE 4 MG PO TABS: 4.0000 mg | ORAL_TABLET | Freq: Two times a day (BID) | ORAL | 0 refills | Status: DC

## 2020-11-23 MED ORDER — ALBUTEROL SULFATE HFA 108 (90 BASE) MCG/ACT IN AERS: 2.0000 | INHALATION_SPRAY | Freq: Four times a day (QID) | RESPIRATORY_TRACT | 0 refills | Status: DC | PRN

## 2020-11-23 MED ORDER — AZITHROMYCIN 250 MG PO TABS: ORAL_TABLET | ORAL | 0 refills | Status: DC

## 2020-11-23 NOTE — Progress Notes (Signed)
Started 3 weeks ago had Covid Still having a hard time breathing  Congestion (in chest), feels like lungs are "coated in water"   Taking allergy medication but no other OTC meds for symptoms
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
## 2020-12-07 ENCOUNTER — Encounter: Payer: Self-pay | Admitting: Physician Assistant

## 2020-12-07 ENCOUNTER — Other Ambulatory Visit: Payer: Self-pay

## 2020-12-07 ENCOUNTER — Ambulatory Visit: Payer: BC Managed Care – PPO | Admitting: Physician Assistant

## 2020-12-07 VITALS — BP 140/74 | HR 82 | Ht 72.0 in | Wt 162.0 lb

## 2020-12-07 DIAGNOSIS — Z1329 Encounter for screening for other suspected endocrine disorder: Secondary | ICD-10-CM | POA: Diagnosis not present

## 2020-12-07 DIAGNOSIS — J432 Centrilobular emphysema: Secondary | ICD-10-CM

## 2020-12-07 DIAGNOSIS — Z125 Encounter for screening for malignant neoplasm of prostate: Secondary | ICD-10-CM | POA: Diagnosis not present

## 2020-12-07 DIAGNOSIS — Z0000 Encounter for general adult medical examination without abnormal findings: Secondary | ICD-10-CM | POA: Diagnosis not present

## 2020-12-07 DIAGNOSIS — R350 Frequency of micturition: Secondary | ICD-10-CM

## 2020-12-07 DIAGNOSIS — F17200 Nicotine dependence, unspecified, uncomplicated: Secondary | ICD-10-CM

## 2020-12-07 DIAGNOSIS — Z13220 Encounter for screening for lipoid disorders: Secondary | ICD-10-CM

## 2020-12-07 DIAGNOSIS — J441 Chronic obstructive pulmonary disease with (acute) exacerbation: Secondary | ICD-10-CM

## 2020-12-07 DIAGNOSIS — Z1211 Encounter for screening for malignant neoplasm of colon: Secondary | ICD-10-CM

## 2020-12-07 DIAGNOSIS — N401 Enlarged prostate with lower urinary tract symptoms: Secondary | ICD-10-CM

## 2020-12-07 DIAGNOSIS — Z131 Encounter for screening for diabetes mellitus: Secondary | ICD-10-CM

## 2020-12-07 MED ORDER — TRELEGY ELLIPTA 100-62.5-25 MCG/INH IN AEPB: 1.0000 | INHALATION_SPRAY | Freq: Every day | RESPIRATORY_TRACT | 5 refills | Status: DC

## 2020-12-07 MED ORDER — PREDNISONE 20 MG PO TABS: ORAL_TABLET | ORAL | 0 refills | Status: DC

## 2020-12-07 MED ORDER — FINASTERIDE 5 MG PO TABS: 5.0000 mg | ORAL_TABLET | Freq: Every day | ORAL | 1 refills | Status: DC

## 2020-12-07 NOTE — Patient Instructions (Signed)
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
## 2020-12-08 ENCOUNTER — Encounter: Payer: Self-pay | Admitting: Gastroenterology

## 2020-12-08 LAB — CBC WITH DIFFERENTIAL/PLATELET
Absolute Monocytes: 827 cells/uL (ref 200–950)
Basophils Absolute: 44 cells/uL (ref 0–200)
Basophils Relative: 0.5 %
Eosinophils Absolute: 238 cells/uL (ref 15–500)
Eosinophils Relative: 2.7 %
HCT: 40.4 % (ref 38.5–50.0)
Hemoglobin: 14.3 g/dL (ref 13.2–17.1)
Lymphs Abs: 1804 cells/uL (ref 850–3900)
MCH: 32.5 pg (ref 27.0–33.0)
MCHC: 35.4 g/dL (ref 32.0–36.0)
MCV: 91.8 fL (ref 80.0–100.0)
MPV: 10 fL (ref 7.5–12.5)
Monocytes Relative: 9.4 %
Neutro Abs: 5887 cells/uL (ref 1500–7800)
Neutrophils Relative %: 66.9 %
Platelets: 290 10*3/uL (ref 140–400)
RBC: 4.4 10*6/uL (ref 4.20–5.80)
RDW: 12 % (ref 11.0–15.0)
Total Lymphocyte: 20.5 %
WBC: 8.8 10*3/uL (ref 3.8–10.8)

## 2020-12-08 LAB — COMPLETE METABOLIC PANEL WITH GFR
AG Ratio: 1.8 (calc) (ref 1.0–2.5)
ALT: 49 U/L — ABNORMAL HIGH (ref 9–46)
AST: 31 U/L (ref 10–35)
Albumin: 4.2 g/dL (ref 3.6–5.1)
Alkaline phosphatase (APISO): 76 U/L (ref 35–144)
BUN: 14 mg/dL (ref 7–25)
CO2: 27 mmol/L (ref 20–32)
Calcium: 9.5 mg/dL (ref 8.6–10.3)
Chloride: 104 mmol/L (ref 98–110)
Creat: 0.8 mg/dL (ref 0.70–1.33)
GFR, Est African American: 113 mL/min/{1.73_m2} (ref >=60)
GFR, Est Non African American: 98 mL/min/{1.73_m2} (ref >=60)
Globulin: 2.4 g/dL (calc) (ref 1.9–3.7)
Glucose, Bld: 99 mg/dL (ref 65–99)
Potassium: 4.2 mmol/L (ref 3.5–5.3)
Sodium: 137 mmol/L (ref 135–146)
Total Bilirubin: 0.9 mg/dL (ref 0.2–1.2)
Total Protein: 6.6 g/dL (ref 6.1–8.1)

## 2020-12-08 LAB — TSH: TSH: 0.74 mIU/L (ref 0.40–4.50)

## 2020-12-08 LAB — LIPID PANEL W/REFLEX DIRECT LDL
Cholesterol: 147 mg/dL (ref <200)
HDL: 56 mg/dL (ref >=40)
LDL Cholesterol (Calc): 76 mg/dL (calc)
Non-HDL Cholesterol (Calc): 91 mg/dL (calc) (ref <130)
Total CHOL/HDL Ratio: 2.6 (calc) (ref <5.0)
Triglycerides: 69 mg/dL (ref <150)

## 2020-12-08 LAB — PSA, TOTAL AND FREE
PSA, % Free: 29 % (calc) (ref >25)
PSA, Free: 0.2 ng/mL
PSA, Total: 0.7 ng/mL (ref <=4.0)

## 2020-12-08 NOTE — Progress Notes (Signed)
Loranzo,   Cholesterol looks fantastic. Thyroid normal.  Kidney and glucose look good.  One liver enzyme just a tad up could even be elevated from recent covid infection. Will monitor.  PSA still pending.

## 2020-12-09 NOTE — Progress Notes (Signed)
Your PSA is normal which is great. Follow up in 1-2 months to see if urinary symptoms improve with medication.

## 2020-12-13 ENCOUNTER — Encounter: Payer: Self-pay | Admitting: Physician Assistant

## 2020-12-19 ENCOUNTER — Other Ambulatory Visit: Payer: Self-pay | Admitting: *Deleted

## 2020-12-19 DIAGNOSIS — F17210 Nicotine dependence, cigarettes, uncomplicated: Secondary | ICD-10-CM

## 2020-12-30 ENCOUNTER — Ambulatory Visit: Payer: BC Managed Care – PPO

## 2021-01-15 ENCOUNTER — Other Ambulatory Visit: Payer: Self-pay | Admitting: Physician Assistant

## 2021-01-15 DIAGNOSIS — F17200 Nicotine dependence, unspecified, uncomplicated: Secondary | ICD-10-CM

## 2021-01-15 DIAGNOSIS — U071 COVID-19: Secondary | ICD-10-CM

## 2021-01-15 DIAGNOSIS — R059 Cough, unspecified: Secondary | ICD-10-CM

## 2021-01-15 DIAGNOSIS — R0602 Shortness of breath: Secondary | ICD-10-CM

## 2021-01-19 ENCOUNTER — Other Ambulatory Visit: Payer: Self-pay

## 2021-01-19 ENCOUNTER — Ambulatory Visit (AMBULATORY_SURGERY_CENTER): Payer: BC Managed Care – PPO

## 2021-01-19 VITALS — Ht 72.0 in | Wt 160.0 lb

## 2021-01-19 DIAGNOSIS — Z1211 Encounter for screening for malignant neoplasm of colon: Secondary | ICD-10-CM

## 2021-01-19 MED ORDER — CLENPIQ 10-3.5-12 MG-GM -GM/160ML PO SOLN: 1.0000 | Freq: Once | ORAL | 0 refills | Status: AC

## 2021-01-19 NOTE — Progress Notes (Signed)
Pt verified name, DOB, address and insurance during PV today.   Pt mailed instruction packet  copy of consent form to read and not return, and instructions. Clenpiq coupon mailed in packet. PV completed over the phone. Pt encouraged to call with questions or issues.  No allergies to soy or egg Pt is not on blood thinners or diet pills Denies issues with sedation/intubation Denies atrial flutter/fib Denies constipation   Pt is aware of Covid safety and care partner requirements.    Pt notes he had COVID 19 about 3 months ago.

## 2021-01-30 ENCOUNTER — Ambulatory Visit (INDEPENDENT_AMBULATORY_CARE_PROVIDER_SITE_OTHER): Payer: BC Managed Care – PPO

## 2021-01-30 ENCOUNTER — Encounter: Payer: Self-pay | Admitting: Acute Care

## 2021-01-30 ENCOUNTER — Ambulatory Visit (INDEPENDENT_AMBULATORY_CARE_PROVIDER_SITE_OTHER): Payer: BC Managed Care – PPO | Admitting: Acute Care

## 2021-01-30 ENCOUNTER — Other Ambulatory Visit: Payer: Self-pay

## 2021-01-30 DIAGNOSIS — F17210 Nicotine dependence, cigarettes, uncomplicated: Secondary | ICD-10-CM | POA: Diagnosis not present

## 2021-01-30 DIAGNOSIS — Z122 Encounter for screening for malignant neoplasm of respiratory organs: Secondary | ICD-10-CM | POA: Diagnosis not present

## 2021-01-30 NOTE — Progress Notes (Signed)
Virtual Visit via Telephone Note  I connected with Jonathan Baker on 01/30/21 at  2:30 PM EDT by telephone and verified that I am speaking with the correct person using two identifiers.  Location: Patient: At home Provider: [place], [place], [place], [address]   I discussed the limitations, risks, security and privacy concerns of performing an evaluation and management service by telephone and the availability of in person appointments. I also discussed with the patient that there may be a patient responsible charge related to this service. The patient expressed understanding and agreed to proceed.    Shared Decision Making Visit Lung Cancer Screening Program [phone])   Eligibility:  Age 60 y.o.  Pack Years Smoking History Calculation 102 pack years (# packs/per year x # years smoked)  Recent History of coughing up blood  no  Unexplained weight loss? no ( >Than 15 pounds within the last 6 months )  Prior History Lung / other cancer no (Diagnosis within the last 5 years already requiring surveillance chest CT Scans).  Smoking Status Current Smoker  Former Smokers: Years since quit: NA  Quit Date: NA  Visit Components:  Discussion included one or more decision making aids. yes  Discussion included risk/benefits of screening. yes  Discussion included potential follow up diagnostic testing for abnormal scans. yes  Discussion included meaning and risk of over diagnosis. yes  Discussion included meaning and risk of False Positives. yes  Discussion included meaning of total radiation exposure. yes  Counseling Included:  Importance of adherence to annual lung cancer LDCT screening. yes  Impact of comorbidities on ability to participate in the program. yes  Ability and willingness to under diagnostic treatment. yes  Smoking Cessation Counseling:  Current Smokers:   Discussed importance of smoking cessation. yes  Information about tobacco cessation  classes and interventions provided to patient. yes  Patient provided with "ticket" for LDCT Scan. yes  Symptomatic Patient. no  CounselingNA  Diagnosis Code: Tobacco Use Z72.0  Asymptomatic Patient yes  Counseling (Intermediate counseling: > three minutes counseling) K1601  Former Smokers:   Discussed the importance of maintaining cigarette abstinence. yes  Diagnosis Code: Personal History of Nicotine Dependence. U93.235  Information about tobacco cessation classes and interventions provided to patient. Yes  Patient provided with "ticket" for LDCT Scan. yes  Written Order for Lung Cancer Screening with LDCT placed in Epic. Yes (CT Chest Lung Cancer Screening Low Dose W/O CM) [id number] Z12.2-Screening of respiratory organs Z87.891-Personal history of nicotine dependence  I have spent 25 minutes of face to face time with Jonathan Baker discussing the risks and benefits of lung cancer screening. We viewed a power point together that explained in detail the above noted topics. We paused at intervals to allow for questions to be asked and answered to ensure understanding.We discussed that the single most powerful action that he can take to decrease his risk of developing lung cancer is to quit smoking. We discussed whether or not he is ready to commit to setting a quit date. We discussed options for tools to aid in quitting smoking including nicotine replacement therapy, non-nicotine medications, support groups, Quit Smart classes, and behavior modification. We discussed that often times setting smaller, more achievable goals, such as eliminating 1 cigarette a day for a week and then 2 cigarettes a day for a week can be helpful in slowly decreasing the number of cigarettes smoked. This allows for a sense of accomplishment as well as providing a clinical benefit. I gave him  the " Be Stronger Than Your Excuses" card with contact information for community resources, classes, free nicotine replacement  therapy, and access to mobile apps, text messaging, and on-line smoking cessation help. I have also given him my card and contact information in the event he needs to contact me. We discussed the time and location of the scan, and that either Doroteo Glassman RN or I will call with the results within 24-48 hours of receiving them. I have offered him  a copy of the power point we viewed  as a resource in the event they need reinforcement of the concepts we discussed today in the office. The patient verbalized understanding of all of  the above and had no further questions upon leaving the office. They have my contact information in the event they have any further questions.  I spent 3-4 minutes counseling on smoking cessation and the health risks of continued tobacco abuse.  I explained to the patient that there has been a high incidence of coronary artery disease noted on these exams. I explained that this is a non-gated exam therefore degree or severity cannot be determined. This patient is currently on statin therapy. I have asked the patient to follow-up with their PCP regarding any incidental finding of coronary artery disease and management with diet or medication as their PCP  feels is clinically indicated. The patient verbalized understanding of the above and had no further questions upon completion of the visit.      Magdalen Spatz, NP 01/30/2021

## 2021-01-30 NOTE — Patient Instructions (Signed)
Thank you for participating in the [facility]. It was our pleasure to meet you today. We will call you with the results of your scan within the next few days. Your scan will be assigned a Lung RADS category score by the physicians reading the scans.  This Lung RADS score determines follow up scanning.  See below for description of categories, and follow up screening recommendations. We will be in touch to schedule your follow up screening annually or based on recommendations of our providers. We will fax a copy of your scan results to your Primary Care Physician, or the physician who referred you to the program, to ensure they have the results. Please call the office if you have any questions or concerns regarding your scanning experience or results.  Our office number is [phone]. Please speak with Denise Phelps, RN. She is our Lung Cancer Screening RN. If she is unavailable when you call, please have the office staff send her a message. She will return your call at her earliest convenience. Remember, if your scan is normal, we will scan you annually as long as you continue to meet the criteria for the program. (Age 55-77, Current smoker or smoker who has quit within the last 15 years). If you are a smoker, remember, quitting is the single most powerful action that you can take to decrease your risk of lung cancer and other pulmonary, breathing related problems. We know quitting is hard, and we are here to help.  Please let us know if there is anything we can do to help you meet your goal of quitting. If you are a former smoker, congratulations. We are proud of you! Remain smoke free! Remember you can refer friends or family members through the number above.  We will screen them to make sure they meet criteria for the program. Thank you for helping us take better care of you by participating in Lung Screening.  Lung RADS Categories:  Lung RADS 1: no nodules  or definitely non-concerning nodules.  Recommendation is for a repeat annual scan in 12 months.  Lung RADS 2:  nodules that are non-concerning in appearance and behavior with a very low likelihood of becoming an active cancer. Recommendation is for a repeat annual scan in 12 months.  Lung RADS 3: nodules that are probably non-concerning , includes nodules with a low likelihood of becoming an active cancer.  Recommendation is for a 6-month repeat screening scan. Often noted after an upper respiratory illness. We will be in touch to make sure you have no questions, and to schedule your 6-month scan.  Lung RADS 4 A: nodules with concerning findings, recommendation is most often for a follow up scan in 3 months or additional testing based on our provider's assessment of the scan. We will be in touch to make sure you have no questions and to schedule the recommended 3 month follow up scan.  Lung RADS 4 B:  indicates findings that are concerning. We will be in touch with you to schedule additional diagnostic testing based on our provider's  assessment of the scan.   

## 2021-01-31 ENCOUNTER — Encounter: Payer: Self-pay | Admitting: Gastroenterology

## 2021-02-02 ENCOUNTER — Other Ambulatory Visit: Payer: Self-pay

## 2021-02-02 ENCOUNTER — Encounter: Payer: Self-pay | Admitting: Gastroenterology

## 2021-02-02 ENCOUNTER — Ambulatory Visit (AMBULATORY_SURGERY_CENTER): Payer: BC Managed Care – PPO | Admitting: Gastroenterology

## 2021-02-02 VITALS — BP 111/69 | HR 72 | Temp 99.3°F | Resp 18 | Ht 72.0 in | Wt 165.0 lb

## 2021-02-02 DIAGNOSIS — Z1211 Encounter for screening for malignant neoplasm of colon: Secondary | ICD-10-CM

## 2021-02-02 DIAGNOSIS — K635 Polyp of colon: Secondary | ICD-10-CM

## 2021-02-02 DIAGNOSIS — D123 Benign neoplasm of transverse colon: Secondary | ICD-10-CM | POA: Diagnosis not present

## 2021-02-02 DIAGNOSIS — D120 Benign neoplasm of cecum: Secondary | ICD-10-CM

## 2021-02-02 MED ORDER — SODIUM CHLORIDE 0.9 % IV SOLN: 500.0000 mL | Freq: Once | INTRAVENOUS | Status: DC

## 2021-02-02 NOTE — Progress Notes (Signed)
Called to room to assist during endoscopic procedure.  Patient ID and intended procedure confirmed with present staff. Received instructions for my participation in the procedure from the performing physician.  

## 2021-02-02 NOTE — Progress Notes (Signed)
Report given to PACU, vss 

## 2021-02-02 NOTE — Patient Instructions (Signed)
Handout given:  Polyps, diverticulosis, Hemorrhoids, high fiber diet Start a high fiber diet Continue current medications Await pathology results Repeat colonoscopy in 6 month to 1 year  YOU HAD AN ENDOSCOPIC PROCEDURE TODAY AT [facility]:   Refer to the procedure report that was given to you for any specific questions about what was found during the examination.  If the procedure report does not answer your questions, please call your gastroenterologist to clarify.  If you requested that your care partner not be given the details of your procedure findings, then the procedure report has been included in a sealed envelope for you to review at your convenience later.  YOU SHOULD EXPECT: Some feelings of bloating in the abdomen. Passage of more gas than usual.  Walking can help get rid of the air that was put into your GI tract during the procedure and reduce the bloating. If you had a lower endoscopy (such as a colonoscopy or flexible sigmoidoscopy) you may notice spotting of blood in your stool or on the toilet paper. If you underwent a bowel prep for your procedure, you may not have a normal bowel movement for a few days.  Please Note:  You might notice some irritation and congestion in your nose or some drainage.  This is from the oxygen used during your procedure.  There is no need for concern and it should clear up in a day or so.  SYMPTOMS TO REPORT IMMEDIATELY:   Following lower endoscopy (colonoscopy or flexible sigmoidoscopy):  Excessive amounts of blood in the stool  Significant tenderness or worsening of abdominal pains  Swelling of the abdomen that is new, acute  Fever of 100F or higher  For urgent or emergent issues, a gastroenterologist can be reached at any hour by calling [phone]. Do not use MyChart messaging for urgent concerns.   DIET:  We do recommend a small meal at first, but then you may proceed to your regular diet.  Drink plenty of fluids but  you should avoid alcoholic beverages for 24 hours.  ACTIVITY:  You should plan to take it easy for the rest of today and you should NOT DRIVE or use heavy machinery until tomorrow (because of the sedation medicines used during the test).    FOLLOW UP: Our staff will call the number listed on your records 48-72 hours following your procedure to check on you and address any questions or concerns that you may have regarding the information given to you following your procedure. If we do not reach you, we will leave a message.  We will attempt to reach you two times.  During this call, we will ask if you have developed any symptoms of COVID 19. If you develop any symptoms (ie: fever, flu-like symptoms, shortness of breath, cough etc.) before then, please call [phone].  If you test positive for Covid 19 in the 2 weeks post procedure, please call and report this information to [place].    If any biopsies were taken you will be contacted by phone or by letter within the next 1-3 weeks.  Please call us at [phone] if you have not heard about the biopsies in 3 weeks.   SIGNATURES/CONFIDENTIALITY: You and/or your care partner have signed paperwork which will be entered into your electronic medical record.  These signatures attest to the fact that that the information above on your After Visit Summary has been reviewed and is understood.  Full responsibility of the confidentiality of this  discharge information lies with you and/or your care-partner. 

## 2021-02-02 NOTE — Op Note (Signed)
[facility] Patient Name: Jonathan Baker Procedure Date: 02/02/2021 9:01 AM MRN: [id number] Endoscopist: Jackquline Denmark , MD Age: 60 Referring MD:  Date of Birth: Jul 15, 1961 Gender: Male Account #: [id number] Procedure:                Colonoscopy Indications:              Screening for colorectal malignant neoplasm Medicines:                Monitored Anesthesia Care Procedure:                Pre-Anesthesia Assessment:                           - Prior to the procedure, a History and Physical                            was performed, and patient medications and                            allergies were reviewed. The patient's tolerance of                            previous anesthesia was also reviewed. The risks                            and benefits of the procedure and the sedation                            options and risks were discussed with the patient.                            All questions were answered, and informed consent                            was obtained. Prior Anticoagulants: The patient has                            taken no previous anticoagulant or antiplatelet                            agents. ASA Grade Assessment: II - A patient with                            mild systemic disease. After reviewing the risks                            and benefits, the patient was deemed in                            satisfactory condition to undergo the procedure.                           After obtaining informed consent, the colonoscope  was passed under direct vision. Throughout the                            procedure, the patient's blood pressure, pulse, and                            oxygen saturations were monitored continuously. The                            Olympus PCF-HQ190 Colonoscope was introduced                            through the anus and advanced to the the cecum,                            identified by appendiceal  orifice and ileocecal                            valve. The colonoscopy was performed without                            difficulty. The patient tolerated the procedure                            well. The quality of the bowel preparation was fair. Scope In: 9:15:27 AM Scope Out: 9:43:32 AM Scope Withdrawal Time: 0 hours 13 minutes 39 seconds  Total Procedure Duration: 0 hours 28 minutes 5 seconds  Findings:                 A 2 mm polyp was found in the cecum. The polyp was                            sessile. The polyp was removed with a cold biopsy                            forceps. Resection and retrieval were complete.                           Two sessile polyps were found in the mid transverse                            colon and distal transverse colon. The polyps were                            4 to 6 mm in size. These polyps were removed with a                            cold snare. Resection and retrieval were complete.                           Multiple medium-mouthed diverticula were found in  the sigmoid colon, descending colon and transverse                            colon.                           Non-bleeding internal hemorrhoids were found during                            retroflexion. The hemorrhoids were small.                           The exam was otherwise without abnormality on                            direct and retroflexion views. Complications:            No immediate complications. Estimated Blood Loss:     Estimated blood loss: none. Impression:               - Preparation of the colon was fair.                           - One 2 mm polyp in the cecum, removed with a cold                            biopsy forceps. Resected and retrieved.                           - Two 4 to 6 mm polyps in the mid transverse colon                            and in the distal transverse colon, removed with a                            cold snare.  Resected and retrieved.                           - Diverticulosis in the sigmoid colon, in the                            descending colon and in the transverse colon.                           - Non-bleeding internal hemorrhoids.                           - The examination was otherwise normal on direct                            and retroflexion views. Recommendation:           - Patient has a contact number available for                            emergencies. The signs and  symptoms of potential                            delayed complications were discussed with the                            patient. Return to normal activities tomorrow.                            Written discharge instructions were provided to the                            patient.                           - High fiber diet.                           - Continue present medications.                           - Await pathology results.                           - Repeat colonoscopy in 6 months- 1 year with 2 day                            prep for screening purposes.                           - The findings and recommendations were discussed                            with the patient's family. Jackquline Denmark, MD 02/02/2021 9:48:11 AM This report has been signed electronically.

## 2021-02-06 ENCOUNTER — Telehealth: Payer: Self-pay

## 2021-02-06 ENCOUNTER — Telehealth: Payer: Self-pay | Admitting: *Deleted

## 2021-02-06 NOTE — Telephone Encounter (Signed)
Left message on follow up call. 

## 2021-02-06 NOTE — Telephone Encounter (Signed)
  Follow up Call-  Call back number 02/02/2021  Post procedure Call Back phone  # [phone]  Permission to leave phone message Yes  Some recent data might be hidden     Patient questions:  Do you have a fever, pain , or abdominal swelling? No. Pain Score  0 *  Have you tolerated food without any problems? Yes.    Have you been able to return to your normal activities? Yes.    Do you have any questions about your discharge instructions: Diet   No. Medications  No. Follow up visit  No.  Do you have questions or concerns about your Care? No.  Actions: * If pain score is 4 or above: No action needed, pain <4.  1. Have you developed a fever since your procedure? no  2.   Have you had an respiratory symptoms (SOB or cough) since your procedure? no  3.   Have you tested positive for COVID 19 since your procedure no  4.   Have you had any family members/close contacts diagnosed with the COVID 19 since your procedure?  no   If yes to any of these questions please route to Joylene John, RN and Joella Prince, RN

## 2021-02-13 NOTE — Progress Notes (Signed)
Please call patient and let them  know their  low dose Ct was read as a Lung RADS 1, negative study: no nodules or definitely benign nodules. Radiology recommendation is for a repeat LDCT in 12 months. .Please let them  know we will order and schedule their  annual screening scan for 01/2022. Please let them  know there was notation of CAD on their  scan.  Please remind the patient  that this is a non-gated exam therefore degree or severity of disease  cannot be determined. Please have them  follow up with their PCP regarding potential risk factor modification, dietary therapy or pharmacologic therapy if clinically indicated. Pt.  is currently on statin therapy. Please place order for annual  screening scan for  01/2022 and fax results to PCP. Thanks so much. 

## 2021-02-16 ENCOUNTER — Other Ambulatory Visit: Payer: Self-pay | Admitting: *Deleted

## 2021-02-16 ENCOUNTER — Other Ambulatory Visit: Payer: Self-pay | Admitting: Family Medicine

## 2021-02-16 DIAGNOSIS — F17210 Nicotine dependence, cigarettes, uncomplicated: Secondary | ICD-10-CM

## 2021-02-16 DIAGNOSIS — N400 Enlarged prostate without lower urinary tract symptoms: Secondary | ICD-10-CM

## 2021-02-16 DIAGNOSIS — Z122 Encounter for screening for malignant neoplasm of respiratory organs: Secondary | ICD-10-CM

## 2021-02-16 DIAGNOSIS — I2510 Atherosclerotic heart disease of native coronary artery without angina pectoris: Secondary | ICD-10-CM

## 2021-02-19 ENCOUNTER — Encounter: Payer: Self-pay | Admitting: Gastroenterology

## 2021-02-22 DIAGNOSIS — I2510 Atherosclerotic heart disease of native coronary artery without angina pectoris: Secondary | ICD-10-CM | POA: Insufficient documentation

## 2021-03-17 ENCOUNTER — Ambulatory Visit (INDEPENDENT_AMBULATORY_CARE_PROVIDER_SITE_OTHER): Payer: BC Managed Care – PPO

## 2021-03-17 ENCOUNTER — Other Ambulatory Visit: Payer: Self-pay

## 2021-03-17 ENCOUNTER — Ambulatory Visit: Payer: BC Managed Care – PPO | Admitting: Sports Medicine

## 2021-03-17 DIAGNOSIS — Z9889 Other specified postprocedural states: Secondary | ICD-10-CM

## 2021-03-17 DIAGNOSIS — M25511 Pain in right shoulder: Secondary | ICD-10-CM

## 2021-03-17 DIAGNOSIS — M47812 Spondylosis without myelopathy or radiculopathy, cervical region: Secondary | ICD-10-CM

## 2021-03-17 DIAGNOSIS — M542 Cervicalgia: Secondary | ICD-10-CM

## 2021-03-17 DIAGNOSIS — M2578 Osteophyte, vertebrae: Secondary | ICD-10-CM | POA: Diagnosis not present

## 2021-03-17 DIAGNOSIS — M19011 Primary osteoarthritis, right shoulder: Secondary | ICD-10-CM | POA: Diagnosis not present

## 2021-03-17 DIAGNOSIS — M4319 Spondylolisthesis, multiple sites in spine: Secondary | ICD-10-CM | POA: Diagnosis not present

## 2021-03-17 MED ORDER — PREDNISONE 50 MG PO TABS: ORAL_TABLET | ORAL | 0 refills | Status: DC

## 2021-03-17 MED ORDER — GABAPENTIN 300 MG PO CAPS: ORAL_CAPSULE | ORAL | 3 refills | Status: DC

## 2021-03-17 NOTE — Assessment & Plan Note (Addendum)
Edker is a pleasant 60 year old male, approximately 6 years ago he had repair of a massive rotator cuff repair, he is having increasing pain in the shoulder, crepitus, his cuff is strong, I think he likely has cuff tear arthropathy. Getting some x-rays, medications as above. Cuff strengthening exercises given, return to see me in a month, glenohumeral injection if no better. Exacerbation of a chronic disease process with pharmacologic management.

## 2021-03-17 NOTE — Assessment & Plan Note (Signed)
Several weeks ago was moving sheets on the bed and felt severe pain in the right side of his neck, radiation to the right periscapular region. He has bilateral hand paresthesias. Of note he did have a nerve conduction study that showed both cervical radiculopathy and carpal tunnel syndrome. I think we can get this better with Neurontin and a 5-day burst of prednisone as well as home rehab exercises, doing all of the above, cervical spine x-rays, return to see me in a month, MRI for epidural planning if no better.

## 2021-03-17 NOTE — Progress Notes (Signed)
    Procedures performed today:    None.  Independent interpretation of notes and tests performed by another provider:   None.  Brief History, Exam, Impression, and Recommendations:    Status post arthroscopic repair of massive right shoulder rotator cuff tear (2016) Jonathan Baker is a pleasant 60 year old male, approximately 6 years ago he had repair of a massive rotator cuff repair, he is having increasing pain in the shoulder, crepitus, his cuff is strong, I think he likely has cuff tear arthropathy. Getting some x-rays, medications as above. Cuff strengthening exercises given, return to see me in a month, glenohumeral injection if no better. Exacerbation of a chronic disease process with pharmacologic management.  Cervical spondylosis Several weeks ago was moving sheets on the bed and felt severe pain in the right side of his neck, radiation to the right periscapular region. He has bilateral hand paresthesias. Of note he did have a nerve conduction study that showed both cervical radiculopathy and carpal tunnel syndrome. I think we can get this better with Neurontin and a 5-day burst of prednisone as well as home rehab exercises, doing all of the above, cervical spine x-rays, return to see me in a month, MRI for epidural planning if no better.    ___________________________________________ Gwen Her. Dianah Field, M.D., ABFM., CAQSM. Primary Care and [place] Instructor of [place] of [facility] of Medicine

## 2021-03-20 ENCOUNTER — Encounter: Payer: BC Managed Care – PPO | Admitting: Sports Medicine

## 2021-04-14 ENCOUNTER — Other Ambulatory Visit: Payer: Self-pay

## 2021-04-14 ENCOUNTER — Ambulatory Visit (INDEPENDENT_AMBULATORY_CARE_PROVIDER_SITE_OTHER): Payer: BC Managed Care – PPO

## 2021-04-14 ENCOUNTER — Ambulatory Visit: Payer: BC Managed Care – PPO | Admitting: Sports Medicine

## 2021-04-14 DIAGNOSIS — M47812 Spondylosis without myelopathy or radiculopathy, cervical region: Secondary | ICD-10-CM | POA: Diagnosis not present

## 2021-04-14 DIAGNOSIS — Z9889 Other specified postprocedural states: Secondary | ICD-10-CM | POA: Diagnosis not present
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
## 2021-04-14 NOTE — Progress Notes (Signed)
    Procedures performed today:    Procedure: Real-time Ultrasound Guided injection of the right glenohumeral joint Device: Samsung HS60  Verbal informed consent obtained.  Time-out conducted.  Noted no overlying erythema, induration, or other signs of local infection.  Skin prepped in a sterile fashion.  Local anesthesia: Topical Ethyl chloride.  With sterile technique and under real time ultrasound guidance: Noted arthritic joint, 1 cc Kenalog 40, 2 cc lidocaine, 2 cc bupivacaine injected easily Completed without difficulty  Advised to call if fevers/chills, erythema, induration, drainage, or persistent bleeding.  Images permanently stored and available for review in PACS.  Impression: Technically successful ultrasound guided injection.  Independent interpretation of notes and tests performed by another provider:   None.  Brief History, Exam, Impression, and Recommendations:    Status post arthroscopic repair of massive right shoulder rotator cuff tear (2016) Jonathan Baker returns, he is 6 years post massive cuff repair, likely failure of cuff repair and resultant rotator cuff arthropathy with glenohumeral osteoarthritis and loss of motion. Today we injected his shoulder, hopefully he can regain some motion although for him to regain full abduction he will need a reverse shoulder arthroplasty. Return to see me in a month.  Cervical spondylosis [name]'s neck is doing a lot better, return as needed for this. Continue Neurontin.   ___________________________________________ Gwen Her. Dianah Field, M.D., ABFM., CAQSM. Primary Care and [place] Instructor of [place] of [facility] of Medicine

## 2021-04-14 NOTE — Assessment & Plan Note (Signed)
Jonathan Baker's neck is doing a lot better, return as needed for this. Continue Neurontin.

## 2021-05-12 ENCOUNTER — Ambulatory Visit: Payer: BC Managed Care – PPO | Admitting: Sports Medicine

## 2021-05-17 ENCOUNTER — Other Ambulatory Visit: Payer: Self-pay | Admitting: Physician Assistant

## 2021-05-17 DIAGNOSIS — N400 Enlarged prostate without lower urinary tract symptoms: Secondary | ICD-10-CM

## 2021-06-13 ENCOUNTER — Other Ambulatory Visit: Payer: Self-pay | Admitting: Physician Assistant

## 2021-06-13 DIAGNOSIS — J432 Centrilobular emphysema: Secondary | ICD-10-CM

## 2021-06-14 ENCOUNTER — Other Ambulatory Visit: Payer: Self-pay | Admitting: Neurology

## 2021-06-14 DIAGNOSIS — N401 Enlarged prostate with lower urinary tract symptoms: Secondary | ICD-10-CM

## 2021-06-14 MED ORDER — FINASTERIDE 5 MG PO TABS: 5.0000 mg | ORAL_TABLET | Freq: Every day | ORAL | 1 refills | Status: DC

## 2021-06-16 ENCOUNTER — Other Ambulatory Visit: Payer: Self-pay | Admitting: Physician Assistant

## 2021-06-16 DIAGNOSIS — N400 Enlarged prostate without lower urinary tract symptoms: Secondary | ICD-10-CM

## 2021-07-03 ENCOUNTER — Other Ambulatory Visit: Payer: Self-pay

## 2021-07-03 DIAGNOSIS — N400 Enlarged prostate without lower urinary tract symptoms: Secondary | ICD-10-CM

## 2021-07-03 MED ORDER — TAMSULOSIN HCL 0.4 MG PO CAPS: 0.4000 mg | ORAL_CAPSULE | Freq: Every day | ORAL | 0 refills | Status: DC

## 2021-07-07 ENCOUNTER — Ambulatory Visit: Payer: BC Managed Care – PPO | Admitting: Physician Assistant

## 2021-07-07 ENCOUNTER — Other Ambulatory Visit: Payer: Self-pay

## 2021-07-07 VITALS — BP 135/75 | HR 72 | Ht 72.0 in | Wt 166.0 lb

## 2021-07-07 DIAGNOSIS — N401 Enlarged prostate with lower urinary tract symptoms: Secondary | ICD-10-CM

## 2021-07-07 DIAGNOSIS — M47812 Spondylosis without myelopathy or radiculopathy, cervical region: Secondary | ICD-10-CM

## 2021-07-07 DIAGNOSIS — J432 Centrilobular emphysema: Secondary | ICD-10-CM | POA: Diagnosis not present

## 2021-07-07 DIAGNOSIS — T466X5A Adverse effect of antihyperlipidemic and antiarteriosclerotic drugs, initial encounter: Secondary | ICD-10-CM

## 2021-07-07 DIAGNOSIS — H833X3 Noise effects on inner ear, bilateral: Secondary | ICD-10-CM | POA: Diagnosis not present

## 2021-07-07 DIAGNOSIS — H9011 Conductive hearing loss, unilateral, right ear, with unrestricted hearing on the contralateral side: Secondary | ICD-10-CM | POA: Insufficient documentation

## 2021-07-07 DIAGNOSIS — R350 Frequency of micturition: Secondary | ICD-10-CM

## 2021-07-07 DIAGNOSIS — H833X1 Noise effects on right inner ear: Secondary | ICD-10-CM

## 2021-07-07 DIAGNOSIS — N400 Enlarged prostate without lower urinary tract symptoms: Secondary | ICD-10-CM

## 2021-07-07 DIAGNOSIS — H938X1 Other specified disorders of right ear: Secondary | ICD-10-CM | POA: Insufficient documentation

## 2021-07-07 DIAGNOSIS — I2510 Atherosclerotic heart disease of native coronary artery without angina pectoris: Secondary | ICD-10-CM

## 2021-07-07 DIAGNOSIS — M791 Myalgia, unspecified site: Secondary | ICD-10-CM

## 2021-07-07 MED ORDER — PRAVASTATIN SODIUM 20 MG PO TABS
20.0000 mg | ORAL_TABLET | Freq: Every day | ORAL | 3 refills | Status: DC
Start: 2021-07-07 — End: 2022-01-05

## 2021-07-07 MED ORDER — GABAPENTIN 300 MG PO CAPS: ORAL_CAPSULE | ORAL | 3 refills | Status: DC

## 2021-07-07 MED ORDER — TAMSULOSIN HCL 0.4 MG PO CAPS: 0.4000 mg | ORAL_CAPSULE | Freq: Every day | ORAL | 3 refills | Status: DC

## 2021-07-07 NOTE — Patient Instructions (Signed)
Continue trelegy once a daily. Add mucinex and albuterol as needed.  Stop lipitor and start pravastatin daily to see if helps with cramps.

## 2021-07-10 ENCOUNTER — Encounter: Payer: Self-pay | Admitting: Physician Assistant

## 2021-07-10 DIAGNOSIS — T466X5A Adverse effect of antihyperlipidemic and antiarteriosclerotic drugs, initial encounter: Secondary | ICD-10-CM | POA: Insufficient documentation

## 2021-07-10 DIAGNOSIS — M791 Myalgia, unspecified site: Secondary | ICD-10-CM | POA: Insufficient documentation
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
## 2021-07-24 ENCOUNTER — Other Ambulatory Visit: Payer: Self-pay | Admitting: Physician Assistant

## 2021-08-24 ENCOUNTER — Other Ambulatory Visit: Payer: Self-pay

## 2021-08-24 DIAGNOSIS — M47812 Spondylosis without myelopathy or radiculopathy, cervical region: Secondary | ICD-10-CM

## 2021-08-24 MED ORDER — GABAPENTIN 300 MG PO CAPS: ORAL_CAPSULE | ORAL | 3 refills | Status: DC

## 2021-10-06 ENCOUNTER — Telehealth (INDEPENDENT_AMBULATORY_CARE_PROVIDER_SITE_OTHER): Payer: BC Managed Care – PPO | Admitting: Physician Assistant

## 2021-10-06 ENCOUNTER — Encounter: Payer: Self-pay | Admitting: Physician Assistant

## 2021-10-06 VITALS — BP 125/84 | HR 73 | Temp 98.7°F | Ht 72.0 in | Wt 155.0 lb

## 2021-10-06 DIAGNOSIS — R509 Fever, unspecified: Secondary | ICD-10-CM | POA: Diagnosis not present

## 2021-10-06 DIAGNOSIS — J441 Chronic obstructive pulmonary disease with (acute) exacerbation: Secondary | ICD-10-CM

## 2021-10-06 MED ORDER — PREDNISONE 50 MG PO TABS: ORAL_TABLET | ORAL | 0 refills | Status: DC

## 2021-10-06 MED ORDER — AZITHROMYCIN 250 MG PO TABS: ORAL_TABLET | ORAL | 0 refills | Status: DC
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
## 2021-10-13 ENCOUNTER — Encounter: Payer: Self-pay | Admitting: Physician Assistant

## 2021-10-13 ENCOUNTER — Other Ambulatory Visit: Payer: Self-pay

## 2021-10-13 ENCOUNTER — Ambulatory Visit (INDEPENDENT_AMBULATORY_CARE_PROVIDER_SITE_OTHER): Payer: BC Managed Care – PPO

## 2021-10-13 ENCOUNTER — Ambulatory Visit: Payer: BC Managed Care – PPO | Admitting: Physician Assistant

## 2021-10-13 VITALS — BP 126/91 | HR 68 | Temp 98.4°F | Ht 72.0 in | Wt 153.0 lb

## 2021-10-13 DIAGNOSIS — R0602 Shortness of breath: Secondary | ICD-10-CM

## 2021-10-13 DIAGNOSIS — J441 Chronic obstructive pulmonary disease with (acute) exacerbation: Secondary | ICD-10-CM

## 2021-10-13 DIAGNOSIS — R5383 Other fatigue: Secondary | ICD-10-CM

## 2021-10-13 DIAGNOSIS — J432 Centrilobular emphysema: Secondary | ICD-10-CM

## 2021-10-13 DIAGNOSIS — R059 Cough, unspecified: Secondary | ICD-10-CM | POA: Diagnosis not present

## 2021-10-13 DIAGNOSIS — J449 Chronic obstructive pulmonary disease, unspecified: Secondary | ICD-10-CM | POA: Diagnosis not present

## 2021-10-13 LAB — CBC WITH DIFFERENTIAL/PLATELET
Absolute Monocytes: 1572 cells/uL — ABNORMAL HIGH (ref 200–950)
Basophils Absolute: 34 cells/uL (ref 0–200)
Basophils Relative: 0.2 %
Eosinophils Absolute: 68 cells/uL (ref 15–500)
Eosinophils Relative: 0.4 %
HCT: 43.2 % (ref 38.5–50.0)
Hemoglobin: 14.6 g/dL (ref 13.2–17.1)
Lymphs Abs: 1859 cells/uL (ref 850–3900)
MCH: 31.2 pg (ref 27.0–33.0)
MCHC: 33.8 g/dL (ref 32.0–36.0)
MCV: 92.3 fL (ref 80.0–100.0)
MPV: 10.2 fL (ref 7.5–12.5)
Monocytes Relative: 9.3 %
Neutro Abs: 13368 cells/uL — ABNORMAL HIGH (ref 1500–7800)
Neutrophils Relative %: 79.1 %
Platelets: 414 10*3/uL — ABNORMAL HIGH (ref 140–400)
RBC: 4.68 10*6/uL (ref 4.20–5.80)
RDW: 12.5 % (ref 11.0–15.0)
Total Lymphocyte: 11 %
WBC: 16.9 10*3/uL — ABNORMAL HIGH (ref 3.8–10.8)

## 2021-10-13 LAB — COMPLETE METABOLIC PANEL WITH GFR
AG Ratio: 1.3 (calc) (ref 1.0–2.5)
ALT: 21 U/L (ref 9–46)
AST: 15 U/L (ref 10–35)
Albumin: 3.9 g/dL (ref 3.6–5.1)
Alkaline phosphatase (APISO): 100 U/L (ref 35–144)
BUN: 15 mg/dL (ref 7–25)
CO2: 28 mmol/L (ref 20–32)
Calcium: 9.6 mg/dL (ref 8.6–10.3)
Chloride: 99 mmol/L (ref 98–110)
Creat: 0.98 mg/dL (ref 0.70–1.35)
Globulin: 3.1 g/dL (calc) (ref 1.9–3.7)
Glucose, Bld: 114 mg/dL — ABNORMAL HIGH (ref 65–99)
Potassium: 4.3 mmol/L (ref 3.5–5.3)
Sodium: 135 mmol/L (ref 135–146)
Total Bilirubin: 0.9 mg/dL (ref 0.2–1.2)
Total Protein: 7 g/dL (ref 6.1–8.1)
eGFR: 88 mL/min/{1.73_m2} (ref >=60)

## 2021-10-13 LAB — I-STAT CREATININE (MANUAL ENTRY): Creatinine, Ser: 1 (ref 0.50–1.10)

## 2021-10-13 MED ORDER — PREDNISONE 20 MG PO TABS: ORAL_TABLET | ORAL | 0 refills | Status: DC

## 2021-10-13 MED ORDER — DOXYCYCLINE HYCLATE 100 MG PO TABS
100.0000 mg | ORAL_TABLET | Freq: Two times a day (BID) | ORAL | 0 refills | Status: DC
Start: 2021-10-13 — End: 2021-12-12

## 2021-10-13 MED ORDER — IOHEXOL 350 MG/ML SOLN
100.0000 mL | Freq: Once | INTRAVENOUS | Status: AC | PRN
  Administered 2021-10-13: 15:00:00 74 mL via INTRAVENOUS
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
## 2021-10-13 NOTE — Progress Notes (Signed)
No pneumonia. Due to SOB will get CTA.

## 2021-10-13 NOTE — Progress Notes (Signed)
No PE. Great news. Aware of cyst on kidneys. Continue with prednisone and doxycycline.

## 2021-10-13 NOTE — Patient Instructions (Addendum)
Immodium for diarrhea Will get CTA Get labs Start prednisone and doxycycline

## 2021-10-17 ENCOUNTER — Encounter: Payer: Self-pay | Admitting: Physician Assistant

## 2021-10-17 DIAGNOSIS — R5383 Other fatigue: Secondary | ICD-10-CM | POA: Insufficient documentation

## 2021-10-17 NOTE — Progress Notes (Signed)
Kidney, liver look good.  WBC up but likely fasely elevated due to prednisone.  How is patient feeling today?

## 2021-12-08 ENCOUNTER — Other Ambulatory Visit: Payer: Self-pay | Admitting: Physician Assistant

## 2021-12-08 DIAGNOSIS — J432 Centrilobular emphysema: Secondary | ICD-10-CM

## 2021-12-08 DIAGNOSIS — N401 Enlarged prostate with lower urinary tract symptoms: Secondary | ICD-10-CM

## 2021-12-11 ENCOUNTER — Telehealth: Payer: Self-pay | Admitting: Neurology

## 2021-12-11 MED ORDER — AMOXICILLIN-POT CLAVULANATE 875-125 MG PO TABS: 1.0000 | ORAL_TABLET | Freq: Two times a day (BID) | ORAL | 0 refills | Status: AC

## 2021-12-11 MED ORDER — TETANUS IMMUNE GLOBULIN 250 UNIT/ML IM SOSY
500.0000 [IU] | PREFILLED_SYRINGE | Freq: Once | INTRAMUSCULAR | 0 refills | Status: AC
Start: 2021-12-11 — End: 2021-12-11

## 2021-12-11 NOTE — Telephone Encounter (Signed)
I sent TIG to pharmacy that we could give tomorrow at appt if affordable. Augmentin was also sent.

## 2021-12-11 NOTE — Telephone Encounter (Signed)
Patient's wife made aware.

## 2021-12-11 NOTE — Telephone Encounter (Signed)
Patient was bit by a dog on Saturday. He has an appointment tomorrow morning, but having hand swelling and open wound. He is allergic to Tetanus. Please advise.

## 2021-12-12 ENCOUNTER — Encounter: Payer: Self-pay | Admitting: Physician Assistant

## 2021-12-12 ENCOUNTER — Ambulatory Visit: Payer: BC Managed Care – PPO | Admitting: Physician Assistant

## 2021-12-12 ENCOUNTER — Other Ambulatory Visit: Payer: Self-pay

## 2021-12-12 VITALS — BP 145/93 | HR 77 | Ht 72.0 in | Wt 167.0 lb

## 2021-12-12 DIAGNOSIS — W540XXA Bitten by dog, initial encounter: Secondary | ICD-10-CM | POA: Diagnosis not present

## 2021-12-12 DIAGNOSIS — Z23 Encounter for immunization: Secondary | ICD-10-CM | POA: Diagnosis not present

## 2021-12-12 DIAGNOSIS — S61412A Laceration without foreign body of left hand, initial encounter: Secondary | ICD-10-CM

## 2021-12-12 NOTE — Patient Instructions (Signed)
Change bandage twice a day Laceration Care, Adult A laceration is a cut that may go through all layers of the skin and into the tissue that is right under the skin. Some lacerations heal on their own. Others need to be closed with stitches (sutures), staples, skin adhesive strips, or skin glue. Proper care of a laceration reduces the risk for infection, helps the laceration heal better, and may prevent scarring. General tips Keep the wound clean and dry. Do not scratch or pick at the wound. Wash your hands with soap and water for at least 20 seconds before and after touching your wound or changing your bandage (dressing). If soap and water are not available, use hand sanitizer. Do not usedisinfectants or antiseptics, such as rubbing alcohol, to clean your wound unless told by your health care provider. If you were given a dressing, you should change it at least once a day, or as told by your health care provider. You should also change it if it becomes wet or dirty. How to care for your laceration If sutures or staples were used: Keep the wound completely dry for the first 24 hours, or as told by your health care provider. After that time, you may shower or bathe. Do not soak your wound in water until after the sutures or staples have been removed. Clean the wound once each day, or as told by your health care provider. To do this: Wash the wound with soap and water. Rinse the wound with water to remove all soap. Pat the wound dry with a clean towel. Do not rub the wound. After cleaning the wound, apply a thin layer of antibiotic ointment, other topical ointments, or a non-adherent dressing as told by your health care provider. This will help prevent infection and keep the dressing from sticking to the wound. Have the sutures or staples removed as told by your health care provider. Do not  remove sutures or staples yourself. If skin adhesive strips were used: Do not get the skin adhesive strips  wet. You may shower or bathe, but keep the wound dry. If the wound gets wet, pat it dry with a clean towel. Do not rub the wound. Skin adhesive strips fall off on their own. If adhesive strip edges start to loosen and curl up, you may trim the loose edges. Do not remove adhesive strips completely unless your health care provider tells you to do that. If skin glue was used: You may shower or bathe, but try to keep the wound dry. Do not soak the wound in water. After showering or bathing, pat the wound dry with a clean towel. Do not rub the wound. Do not do any activities that will make you sweat a lot until the skin glue has fallen off. Do not apply liquid, cream, or ointment medicine to the wound while the skin glue is in place. Doing this may loosen the film before the wound has healed. If a dressing is placed over the wound, do not apply tape directly over the skin glue. Doing this may cause the glue to be pulled off before the wound has healed. Do not pick at the glue. Skin glue usually remains in place for 5-10 days and then falls off the skin. Follow these instructions at home: Medicines Take over-the-counter and prescription medicines only as told by your health care provider. If you were prescribed an antibiotic medicine or ointment, take or apply it as told by your health care provider. Do not  stop using it even if your condition improves. Managing pain and swelling If directed, put ice on the injured area. To do this: Put ice in a plastic bag. Place a towel between your skin and the bag. Leave the ice on for 20 minutes, 2-3 times a day. Remove the ice if your skin turns bright red. This is very important. If you cannot feel pain, heat, or cold, you have a greater risk of damage to the area. Raise (elevate) the injured area above the level of your heart while you are sitting or lying down for the first 24-48 hours after the laceration is repaired. General instructions  Avoid any  activity that could cause your wound to reopen. Check your wound every day for signs of infection. Watch for: More redness, swelling, or pain. Fluid or blood. Warmth. Pus or a bad smell. Keep all follow-up visits. This is important. Contact a health care provider if: You received a tetanus shot and you have swelling, severe pain, redness, or bleeding at the injection site. Your closed wound breaks open. You have any of these signs of infection: More redness, swelling, or pain around your wound. Fluid or blood coming from your wound. Warmth coming from your wound. Pus or a bad smell coming from your wound. A fever. You notice something coming out of the wound, such as wood or glass. Your pain is not controlled with medicine. You notice a change in the color of your skin near your wound. You need to change the dressing often. You develop a new rash. You have numbness around the wound. Get help right away if: You develop severe swelling around the wound. Your pain suddenly increases and is severe. You develop painful lumps near the wound or on skin anywhere else on your body. You have a red streak going away from your wound. The wound is on your hand or foot, and you cannot properly move a finger or toe. The wound is on your hand or foot, and you notice that your fingers or toes look pale or bluish. Summary A laceration is a cut that may go through all layers of the skin and into the tissue that is right under the skin. Some lacerations heal on their own. Others need to be closed with stitches (sutures), staples, skin adhesive strips, or skin glue. Proper care of a laceration reduces the risk of infection, helps the laceration heal better, and may prevent scarring. This information is not intended to replace advice given to you by your health care provider. Make sure you discuss any questions you have with your health care provider. Document Revised: 12/08/2020 Document Reviewed:  12/08/2020 Elsevier Patient Education  [place].
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
## 2022-01-05 ENCOUNTER — Ambulatory Visit: Payer: BC Managed Care – PPO | Admitting: Physician Assistant

## 2022-01-05 ENCOUNTER — Encounter: Payer: Self-pay | Admitting: Physician Assistant

## 2022-01-05 ENCOUNTER — Other Ambulatory Visit: Payer: Self-pay

## 2022-01-05 VITALS — BP 137/86 | HR 80 | Ht 72.0 in | Wt 168.0 lb

## 2022-01-05 DIAGNOSIS — I2510 Atherosclerotic heart disease of native coronary artery without angina pectoris: Secondary | ICD-10-CM

## 2022-01-05 DIAGNOSIS — E559 Vitamin D deficiency, unspecified: Secondary | ICD-10-CM | POA: Diagnosis not present

## 2022-01-05 DIAGNOSIS — Z13220 Encounter for screening for lipoid disorders: Secondary | ICD-10-CM

## 2022-01-05 DIAGNOSIS — Z0000 Encounter for general adult medical examination without abnormal findings: Secondary | ICD-10-CM | POA: Diagnosis not present

## 2022-01-05 DIAGNOSIS — Z1329 Encounter for screening for other suspected endocrine disorder: Secondary | ICD-10-CM

## 2022-01-05 DIAGNOSIS — R350 Frequency of micturition: Secondary | ICD-10-CM

## 2022-01-05 DIAGNOSIS — M47812 Spondylosis without myelopathy or radiculopathy, cervical region: Secondary | ICD-10-CM

## 2022-01-05 DIAGNOSIS — Z131 Encounter for screening for diabetes mellitus: Secondary | ICD-10-CM | POA: Diagnosis not present

## 2022-01-05 DIAGNOSIS — Z125 Encounter for screening for malignant neoplasm of prostate: Secondary | ICD-10-CM | POA: Diagnosis not present

## 2022-01-05 DIAGNOSIS — J432 Centrilobular emphysema: Secondary | ICD-10-CM

## 2022-01-05 DIAGNOSIS — N401 Enlarged prostate with lower urinary tract symptoms: Secondary | ICD-10-CM

## 2022-01-05 DIAGNOSIS — M545 Low back pain, unspecified: Secondary | ICD-10-CM

## 2022-01-05 MED ORDER — GABAPENTIN 300 MG PO CAPS: ORAL_CAPSULE | ORAL | 3 refills | Status: DC

## 2022-01-05 MED ORDER — FINASTERIDE 5 MG PO TABS: ORAL_TABLET | ORAL | 3 refills | Status: DC

## 2022-01-05 MED ORDER — CYCLOBENZAPRINE HCL 10 MG PO TABS: 10.0000 mg | ORAL_TABLET | Freq: Every day | ORAL | 1 refills | Status: DC

## 2022-01-05 MED ORDER — MONTELUKAST SODIUM 10 MG PO TABS: 10.0000 mg | ORAL_TABLET | Freq: Every day | ORAL | 3 refills | Status: DC

## 2022-01-05 MED ORDER — PRAVASTATIN SODIUM 20 MG PO TABS: 20.0000 mg | ORAL_TABLET | Freq: Every day | ORAL | 3 refills | Status: DC

## 2022-01-05 MED ORDER — TAMSULOSIN HCL 0.4 MG PO CAPS: 0.4000 mg | ORAL_CAPSULE | Freq: Every day | ORAL | 3 refills | Status: DC

## 2022-01-05 NOTE — Patient Instructions (Signed)
Mucinex daily for mucus production. ?Add singulair.  ?
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
## 2022-01-06 LAB — COMPLETE METABOLIC PANEL WITH GFR
AG Ratio: 1.7 (calc) (ref 1.0–2.5)
ALT: 19 U/L (ref 9–46)
AST: 18 U/L (ref 10–35)
Albumin: 4.1 g/dL (ref 3.6–5.1)
Alkaline phosphatase (APISO): 70 U/L (ref 35–144)
BUN: 15 mg/dL (ref 7–25)
CO2: 27 mmol/L (ref 20–32)
Calcium: 9.7 mg/dL (ref 8.6–10.3)
Chloride: 106 mmol/L (ref 98–110)
Creat: 0.78 mg/dL (ref 0.70–1.35)
Globulin: 2.4 g/dL (calc) (ref 1.9–3.7)
Glucose, Bld: 90 mg/dL (ref 65–99)
Potassium: 4.3 mmol/L (ref 3.5–5.3)
Sodium: 140 mmol/L (ref 135–146)
Total Bilirubin: 0.6 mg/dL (ref 0.2–1.2)
Total Protein: 6.5 g/dL (ref 6.1–8.1)
eGFR: 102 mL/min/{1.73_m2} (ref >=60)

## 2022-01-06 LAB — LIPID PANEL W/REFLEX DIRECT LDL
Cholesterol: 183 mg/dL (ref <200)
HDL: 63 mg/dL (ref >=40)
LDL Cholesterol (Calc): 106 mg/dL (calc) — ABNORMAL HIGH
Non-HDL Cholesterol (Calc): 120 mg/dL (calc) (ref <130)
Total CHOL/HDL Ratio: 2.9 (calc) (ref <5.0)
Triglycerides: 47 mg/dL (ref <150)

## 2022-01-06 LAB — CBC WITH DIFFERENTIAL/PLATELET
Absolute Monocytes: 642 cells/uL (ref 200–950)
Basophils Absolute: 48 cells/uL (ref 0–200)
Basophils Relative: 0.7 %
Eosinophils Absolute: 110 cells/uL (ref 15–500)
Eosinophils Relative: 1.6 %
HCT: 39.9 % (ref 38.5–50.0)
Hemoglobin: 13.8 g/dL (ref 13.2–17.1)
Lymphs Abs: 1739 cells/uL (ref 850–3900)
MCH: 32.2 pg (ref 27.0–33.0)
MCHC: 34.6 g/dL (ref 32.0–36.0)
MCV: 93 fL (ref 80.0–100.0)
MPV: 10.3 fL (ref 7.5–12.5)
Monocytes Relative: 9.3 %
Neutro Abs: 4361 cells/uL (ref 1500–7800)
Neutrophils Relative %: 63.2 %
Platelets: 311 10*3/uL (ref 140–400)
RBC: 4.29 10*6/uL (ref 4.20–5.80)
RDW: 12.4 % (ref 11.0–15.0)
Total Lymphocyte: 25.2 %
WBC: 6.9 10*3/uL (ref 3.8–10.8)

## 2022-01-06 LAB — VITAMIN D 25 HYDROXY (VIT D DEFICIENCY, FRACTURES): Vit D, 25-Hydroxy: 39 ng/mL (ref 30–100)

## 2022-01-06 LAB — TSH: TSH: 0.57 mIU/L (ref 0.40–4.50)

## 2022-01-06 LAB — PSA: PSA: 0.15 ng/mL (ref <=4.00)

## 2022-01-08 ENCOUNTER — Encounter: Payer: Self-pay | Admitting: Physician Assistant

## 2022-01-08 ENCOUNTER — Other Ambulatory Visit: Payer: Self-pay | Admitting: Physician Assistant

## 2022-01-08 MED ORDER — MIRABEGRON ER 50 MG PO TB24: 50.0000 mg | ORAL_TABLET | Freq: Every day | ORAL | 1 refills | Status: DC

## 2022-01-08 NOTE — Progress Notes (Signed)
Added myrbetriq daily.  ?

## 2022-01-08 NOTE — Progress Notes (Signed)
PSA is nice and low.  ?We could add another medication to see if it helps your urinary frequency. Would you like to try?  ?Thyroid is great. ?Glucose, kidney, liver look good.  ?Vitamin D looks good.  ?LDL, bad cholesterol, up a little from a year ago. You are taking Pravachol daily?  ? ? ?

## 2022-01-08 NOTE — Progress Notes (Signed)
Add mrybetriq daily for urinary symptoms.

## 2022-03-07 ENCOUNTER — Encounter: Payer: Self-pay | Admitting: Gastroenterology

## 2022-06-28 ENCOUNTER — Other Ambulatory Visit: Payer: Self-pay | Admitting: Physician Assistant

## 2022-06-28 DIAGNOSIS — J432 Centrilobular emphysema: Secondary | ICD-10-CM

## 2022-07-07 ENCOUNTER — Other Ambulatory Visit: Payer: Self-pay | Admitting: Physician Assistant

## 2022-07-20 ENCOUNTER — Other Ambulatory Visit: Payer: Self-pay | Admitting: *Deleted

## 2022-07-20 DIAGNOSIS — Z87891 Personal history of nicotine dependence: Secondary | ICD-10-CM

## 2022-07-20 DIAGNOSIS — F17210 Nicotine dependence, cigarettes, uncomplicated: Secondary | ICD-10-CM

## 2022-07-20 DIAGNOSIS — Z122 Encounter for screening for malignant neoplasm of respiratory organs: Secondary | ICD-10-CM

## 2022-09-03 ENCOUNTER — Encounter: Payer: Self-pay | Admitting: Physician Assistant

## 2022-09-03 ENCOUNTER — Ambulatory Visit: Payer: BC Managed Care – PPO | Admitting: Physician Assistant

## 2022-09-03 VITALS — BP 140/92 | HR 78 | Ht 72.0 in | Wt 161.0 lb

## 2022-09-03 DIAGNOSIS — K648 Other hemorrhoids: Secondary | ICD-10-CM | POA: Diagnosis not present

## 2022-09-03 DIAGNOSIS — K5790 Diverticulosis of intestine, part unspecified, without perforation or abscess without bleeding: Secondary | ICD-10-CM | POA: Diagnosis not present

## 2022-09-03 DIAGNOSIS — K635 Polyp of colon: Secondary | ICD-10-CM | POA: Insufficient documentation

## 2022-09-03 DIAGNOSIS — K625 Hemorrhage of anus and rectum: Secondary | ICD-10-CM

## 2022-09-03 LAB — POCT HEMOGLOBIN: Hemoglobin: 12.8 g/dL (ref 11–14.6)

## 2022-09-03 MED ORDER — HYDROCORTISONE ACETATE 25 MG RE SUPP: 25.0000 mg | Freq: Two times a day (BID) | RECTAL | 1 refills | Status: DC | PRN

## 2022-09-03 NOTE — Progress Notes (Unsigned)
   Acute Office Visit  Subjective:     Patient ID: Jonathan Baker, male    DOB: 1961-09-21, 61 y.o.   MRN: [id number]  No chief complaint on file.   HPI Patient is in today for *** saurday 3:30 am bowel movement twice dizziness done by 5am and not happened since  Formed and soft  No abdominal pain.  Had bowel movement since No rectal pain No external hemorrhoid but hx of internal   Once every 2 or 3 pu  Smoker Drinker Denies colonsopy 01/2021 multiple colon poyps  ROS      Objective:    There were no vitals taken for this visit. {Vitals History (Optional):23777}  Physical Exam  No results found for any visits on 09/03/22.      Assessment & Plan:   Problem List Items Addressed This Visit   None   No orders of the defined types were placed in this encounter.   No follow-ups on file.  Iran Planas, PA-C

## 2022-09-03 NOTE — Patient Instructions (Addendum)
Will call and see if we can get colonoscopy moved up.  Start suppositories for next 2-3 days

## 2022-10-09 NOTE — Progress Notes (Signed)
10/09/2022 Jonathan Baker [id number] 04/08/1961   Chief Complaint:  History of Present Illness: Jonathan Baker is a 61 year old male with a past medical history of innumerable kidney cysts, liver hemangioma, GERD and colon polyps. He is known by Dr. Lyndel Safe.   Seen by his PCP 09/03/2022: Pt got up to go to the bathroom for a bowel movement and had "lots of bright red blood". He was very dizzy and weak but only had 2 episodes of this and it stopped. He has had streaks of bright red blood intermittent over the years but nothing like that. He denies any diarrhea or abdominal pain. His stools are well formed. Denies any rectal pain or itching. No external hemorrhoids.      Latest Ref Rng & Units 09/03/2022    4:29 PM 01/05/2022   12:00 AM 10/13/2021   12:00 AM  CBC  WBC 3.8 - 10.8 Thousand/uL  6.9  16.9   Hemoglobin 11 - 14.6 g/dL 12.8  13.8  14.6   Hematocrit 38.5 - 50.0 %  39.9  43.2   Platelets 140 - 400 Thousand/uL  311  414        Latest Ref Rng & Units 01/05/2022   12:00 AM 10/13/2021    3:03 PM 10/13/2021   12:00 AM  CMP  Glucose 65 - 99 mg/dL 90   114   BUN 7 - 25 mg/dL 15   15   Creatinine 0.70 - 1.35 mg/dL 0.78  1.00  0.98   Sodium 135 - 146 mmol/L 140   135   Potassium 3.5 - 5.3 mmol/L 4.3   4.3   Chloride 98 - 110 mmol/L 106   99   CO2 20 - 32 mmol/L 27   28   Calcium 8.6 - 10.3 mg/dL 9.7   9.6   Total Protein 6.1 - 8.1 g/dL 6.5   7.0   Total Bilirubin 0.2 - 1.2 mg/dL 0.6   0.9   AST 10 - 35 U/L 18   15   ALT 9 - 46 U/L 19   21      Past Medical History:  Diagnosis Date   Crohn disease (HCC)    Diverticulitis    GERD (gastroesophageal reflux disease)    IBS (irritable bowel syndrome)    Past Surgical History:  Procedure Laterality Date   ANKLE SURGERY     COLONOSCOPY     > 10 yrs ago   NASAL SEPTUM SURGERY     SHOULDER SURGERY      Colonoscopy by Dr. Lyndel Safe 02/02/2021: - Preparation of the colon was fair. - One 2 mm polyp in the cecum, removed with a  cold biopsy forceps. Resected and retrieved. - Two 4 to 6 mm polyps in the mid transverse colon and in the distal transverse colon, removed with a cold snare. Resected and retrieved. - Diverticulosis in the sigmoid colon, in the descending - Non-bleeding internal hemorrhoids. - Repeat colonoscopy in 1 year with 2 day prep 1. Surgical [P], colon, cecum, polyp (1) - COLONIC MUCOSA WITH UNDERLYING LYMPHOID AGGREGATE. - NEGATIVE FOR DYSPLASIA. 2. Surgical [P], colon, transverse, polyp (2) - TUBULAR ADENOMA (X2). - NEGATIVE FOR HIGH GRADE DYSPLASIA.   Current Medications, Allergies, Past Medical History, Past Surgical History, Family History and Social History were reviewed in [employer] record.   Review of Systems:   Constitutional: Negative for fever, sweats, chills or weight loss.  Respiratory: Negative for shortness of breath.  Cardiovascular: Negative for chest pain, palpitations and leg swelling.  Gastrointestinal: See HPI.  Musculoskeletal: Negative for back pain or muscle aches.  Neurological: Negative for dizziness, headaches or paresthesias.    Physical Exam: There were no vitals taken for this visit. General: Well developed, w   ***male in no acute distress. Head: Normocephalic and atraumatic. Eyes: No scleral icterus. Conjunctiva pink . Ears: Normal auditory acuity. Mouth: Dentition intact. No ulcers or lesions.  Lungs: Clear throughout to auscultation. Heart: Regular rate and rhythm, no murmur. Abdomen: Soft, nontender and nondistended. No masses or hepatomegaly. Normal bowel sounds x 4 quadrants.  Rectal: *** Musculoskeletal: Symmetrical with no gross deformities. Extremities: No edema. Neurological: Alert oriented x 4. No focal deficits.  Psychological: Alert and cooperative. Normal mood and affect  Assessment and Recommendations:  21) 61 year old male with a history of colon polyps. Two tubular adenomatous polyps removed from the  transverse colon per colonoscopy 01/2021. The bowel prep was fair therefore a repeat colonoscopy in 1 year with a 2 day bowel prep was recommended by Dr. Lyndel Safe.  -Colonoscopy   2) Rectal bleeding -Colonoscopy as ordered above

## 2022-10-10 ENCOUNTER — Other Ambulatory Visit (INDEPENDENT_AMBULATORY_CARE_PROVIDER_SITE_OTHER): Payer: BC Managed Care – PPO

## 2022-10-10 ENCOUNTER — Ambulatory Visit: Payer: BC Managed Care – PPO | Admitting: Nurse Practitioner

## 2022-10-10 ENCOUNTER — Encounter: Payer: Self-pay | Admitting: Nurse Practitioner

## 2022-10-10 VITALS — BP 144/92 | HR 79 | Ht 72.0 in | Wt 164.0 lb

## 2022-10-10 DIAGNOSIS — K5790 Diverticulosis of intestine, part unspecified, without perforation or abscess without bleeding: Secondary | ICD-10-CM

## 2022-10-10 DIAGNOSIS — K922 Gastrointestinal hemorrhage, unspecified: Secondary | ICD-10-CM | POA: Diagnosis not present

## 2022-10-10 DIAGNOSIS — Z86010 Personal history of colonic polyps: Secondary | ICD-10-CM

## 2022-10-10 LAB — CBC WITH DIFFERENTIAL/PLATELET
Basophils Absolute: 0.1 10*3/uL (ref 0.0–0.1)
Basophils Relative: 0.7 % (ref 0.0–3.0)
Eosinophils Absolute: 0.2 10*3/uL (ref 0.0–0.7)
Eosinophils Relative: 2.5 % (ref 0.0–5.0)
HCT: 39.7 % (ref 39.0–52.0)
Hemoglobin: 13.5 g/dL (ref 13.0–17.0)
Lymphocytes Relative: 19 % (ref 12.0–46.0)
Lymphs Abs: 1.5 10*3/uL (ref 0.7–4.0)
MCHC: 34.1 g/dL (ref 30.0–36.0)
MCV: 93.9 fl (ref 78.0–100.0)
Monocytes Absolute: 0.8 10*3/uL (ref 0.1–1.0)
Monocytes Relative: 9.8 % (ref 3.0–12.0)
Neutro Abs: 5.4 10*3/uL (ref 1.4–7.7)
Neutrophils Relative %: 68 % (ref 43.0–77.0)
Platelets: 344 10*3/uL (ref 150.0–400.0)
RBC: 4.23 Mil/uL (ref 4.22–5.81)
RDW: 13.7 % (ref 11.5–15.5)
WBC: 8 10*3/uL (ref 4.0–10.5)

## 2022-10-10 LAB — COMPREHENSIVE METABOLIC PANEL
ALT: 18 U/L (ref 0–53)
AST: 18 U/L (ref 0–37)
Albumin: 4.2 g/dL (ref 3.5–5.2)
Alkaline Phosphatase: 76 U/L (ref 39–117)
BUN: 21 mg/dL (ref 6–23)
CO2: 29 mEq/L (ref 19–32)
Calcium: 9.5 mg/dL (ref 8.4–10.5)
Chloride: 103 mEq/L (ref 96–112)
Creatinine, Ser: 0.88 mg/dL (ref 0.40–1.50)
GFR: 92.95 mL/min (ref >60.00)
Glucose, Bld: 101 mg/dL — ABNORMAL HIGH (ref 70–99)
Potassium: 3.8 mEq/L (ref 3.5–5.1)
Sodium: 138 mEq/L (ref 135–145)
Total Bilirubin: 0.6 mg/dL (ref 0.2–1.2)
Total Protein: 7 g/dL (ref 6.0–8.3)

## 2022-10-10 NOTE — Patient Instructions (Addendum)
Your provider has requested that you go to the basement level for lab work before leaving today. Press "B" on the elevator. The lab is located at the first door on the left as you exit the elevator.   Go to the ED if you have significant blood from the rectum.  Due to recent changes in healthcare laws, you may see the results of your imaging and laboratory studies on MyChart before your provider has had a chance to review them.  We understand that in some cases there may be results that are confusing or concerning to you. Not all laboratory results come back in the same time frame and the provider may be waiting for multiple results in order to interpret others.  Please give [place] 48 hours in order for your provider to thoroughly review all the results before contacting the office for clarification of your results.    Thank you for trusting me with your gastrointestinal care!   Carl Best, CRNP

## 2022-10-11 ENCOUNTER — Encounter: Payer: Self-pay | Admitting: Gastroenterology

## 2022-10-16 ENCOUNTER — Ambulatory Visit: Payer: BC Managed Care – PPO

## 2022-10-21 NOTE — Progress Notes (Signed)
Agree with assessment/plan.  Raj Marylan Glore, MD [facility] [phone]  

## 2022-10-22 ENCOUNTER — Telehealth: Payer: Self-pay | Admitting: Physician Assistant

## 2022-10-22 DIAGNOSIS — M545 Low back pain, unspecified: Secondary | ICD-10-CM

## 2022-10-22 MED ORDER — CYCLOBENZAPRINE HCL 10 MG PO TABS: 10.0000 mg | ORAL_TABLET | Freq: Every day | ORAL | 1 refills | Status: DC

## 2022-10-22 NOTE — Telephone Encounter (Signed)
Request refills on flexeril.

## 2022-11-02 ENCOUNTER — Ambulatory Visit (INDEPENDENT_AMBULATORY_CARE_PROVIDER_SITE_OTHER): Payer: BC Managed Care – PPO

## 2022-11-02 DIAGNOSIS — Z87891 Personal history of nicotine dependence: Secondary | ICD-10-CM

## 2022-11-02 DIAGNOSIS — Z122 Encounter for screening for malignant neoplasm of respiratory organs: Secondary | ICD-10-CM

## 2022-11-02 DIAGNOSIS — F17210 Nicotine dependence, cigarettes, uncomplicated: Secondary | ICD-10-CM | POA: Diagnosis not present

## 2022-11-05 ENCOUNTER — Other Ambulatory Visit: Payer: Self-pay | Admitting: Acute Care

## 2022-11-05 DIAGNOSIS — Z122 Encounter for screening for malignant neoplasm of respiratory organs: Secondary | ICD-10-CM

## 2022-11-05 DIAGNOSIS — Z87891 Personal history of nicotine dependence: Secondary | ICD-10-CM

## 2022-11-05 DIAGNOSIS — F17210 Nicotine dependence, cigarettes, uncomplicated: Secondary | ICD-10-CM

## 2022-11-14 ENCOUNTER — Ambulatory Visit (AMBULATORY_SURGERY_CENTER): Payer: BC Managed Care – PPO

## 2022-11-14 VITALS — Ht 72.0 in | Wt 160.0 lb

## 2022-11-14 DIAGNOSIS — Z86010 Personal history of colonic polyps: Secondary | ICD-10-CM

## 2022-11-14 MED ORDER — NA SULFATE-K SULFATE-MG SULF 17.5-3.13-1.6 GM/177ML PO SOLN: 1.0000 | Freq: Once | ORAL | 0 refills | Status: AC

## 2022-11-14 NOTE — Progress Notes (Signed)

## 2022-11-27 ENCOUNTER — Encounter: Payer: Self-pay | Admitting: Gastroenterology

## 2022-12-07 ENCOUNTER — Telehealth: Payer: Self-pay | Admitting: Gastroenterology

## 2022-12-07 NOTE — Telephone Encounter (Signed)
Inbound call from patient , he is schedule to have a colonoscopy on 02/26 with Dr.Gupta   but patient stated that he is having some  a rectal bleeding and sore throat.Please advise

## 2022-12-07 NOTE — Telephone Encounter (Signed)
Pt stated that he has a very sore throat that feels like razor blades, low grade temp and a cough. Pt colonoscopy was rescheduled for 01/15/2023 at 10:00 AM with Dr. Lyndel Safe Ambulatory referral to GI placed  Prep instructions were created for pt and sent via my chart:   Pt verbalized understanding with all questions answered.

## 2022-12-10 ENCOUNTER — Encounter: Payer: BC Managed Care – PPO | Admitting: Gastroenterology

## 2022-12-11 NOTE — Telephone Encounter (Unsigned)
Prep instructions were sent to pt via my chart.

## 2022-12-26 ENCOUNTER — Other Ambulatory Visit: Payer: Self-pay | Admitting: Physician Assistant

## 2022-12-26 DIAGNOSIS — J432 Centrilobular emphysema: Secondary | ICD-10-CM

## 2023-01-15 ENCOUNTER — Encounter: Payer: BC Managed Care – PPO | Admitting: Gastroenterology

## 2023-01-19 ENCOUNTER — Other Ambulatory Visit: Payer: Self-pay | Admitting: Physician Assistant

## 2023-01-19 DIAGNOSIS — N401 Enlarged prostate with lower urinary tract symptoms: Secondary | ICD-10-CM

## 2023-01-23 ENCOUNTER — Telehealth: Payer: Self-pay | Admitting: Physician Assistant

## 2023-01-23 DIAGNOSIS — J432 Centrilobular emphysema: Secondary | ICD-10-CM

## 2023-01-23 MED ORDER — TRELEGY ELLIPTA 100-62.5-25 MCG/ACT IN AEPB: 1.0000 | INHALATION_SPRAY | Freq: Every day | RESPIRATORY_TRACT | 5 refills | Status: DC

## 2023-01-23 NOTE — Telephone Encounter (Signed)
Pt called requesting a refill of TRELEGY ELLIPTA 100-62.5-25 MCG/ACT AEPB [[id number]] .

## 2023-03-27 ENCOUNTER — Other Ambulatory Visit: Payer: Self-pay

## 2023-03-27 DIAGNOSIS — N401 Enlarged prostate with lower urinary tract symptoms: Secondary | ICD-10-CM

## 2023-03-27 MED ORDER — FINASTERIDE 5 MG PO TABS: ORAL_TABLET | ORAL | 0 refills | Status: DC

## 2023-04-04 ENCOUNTER — Other Ambulatory Visit: Payer: Self-pay | Admitting: *Deleted

## 2023-04-04 MED ORDER — MIRABEGRON ER 50 MG PO TB24: ORAL_TABLET | ORAL | 1 refills | Status: DC

## 2023-04-22 ENCOUNTER — Other Ambulatory Visit: Payer: Self-pay | Admitting: Physician Assistant

## 2023-04-22 DIAGNOSIS — N401 Enlarged prostate with lower urinary tract symptoms: Secondary | ICD-10-CM

## 2023-04-23 ENCOUNTER — Other Ambulatory Visit: Payer: Self-pay | Admitting: Physician Assistant

## 2023-04-23 DIAGNOSIS — M545 Low back pain, unspecified: Secondary | ICD-10-CM

## 2023-07-05 ENCOUNTER — Other Ambulatory Visit: Payer: Self-pay | Admitting: Physician Assistant

## 2023-07-05 DIAGNOSIS — J432 Centrilobular emphysema: Secondary | ICD-10-CM

## 2023-08-07 ENCOUNTER — Other Ambulatory Visit: Payer: Self-pay | Admitting: Physician Assistant

## 2023-08-07 DIAGNOSIS — N401 Enlarged prostate with lower urinary tract symptoms: Secondary | ICD-10-CM

## 2023-11-04 ENCOUNTER — Ambulatory Visit: Payer: BC Managed Care – PPO

## 2023-12-20 ENCOUNTER — Other Ambulatory Visit: Payer: Self-pay | Admitting: Physician Assistant

## 2023-12-20 DIAGNOSIS — N401 Enlarged prostate with lower urinary tract symptoms: Secondary | ICD-10-CM

## 2023-12-21 ENCOUNTER — Other Ambulatory Visit: Payer: Self-pay | Admitting: Physician Assistant

## 2024-01-06 ENCOUNTER — Encounter: Payer: Self-pay | Admitting: Physician Assistant

## 2024-01-06 ENCOUNTER — Ambulatory Visit: Admitting: Physician Assistant

## 2024-01-06 VITALS — BP 137/73 | HR 72 | Ht 72.0 in | Wt 164.0 lb

## 2024-01-06 DIAGNOSIS — R350 Frequency of micturition: Secondary | ICD-10-CM

## 2024-01-06 DIAGNOSIS — N401 Enlarged prostate with lower urinary tract symptoms: Secondary | ICD-10-CM

## 2024-01-06 DIAGNOSIS — Z131 Encounter for screening for diabetes mellitus: Secondary | ICD-10-CM | POA: Diagnosis not present

## 2024-01-06 DIAGNOSIS — M791 Myalgia, unspecified site: Secondary | ICD-10-CM | POA: Diagnosis not present

## 2024-01-06 DIAGNOSIS — F17200 Nicotine dependence, unspecified, uncomplicated: Secondary | ICD-10-CM

## 2024-01-06 DIAGNOSIS — Z5320 Procedure and treatment not carried out because of patient's decision for unspecified reasons: Secondary | ICD-10-CM | POA: Insufficient documentation

## 2024-01-06 DIAGNOSIS — J432 Centrilobular emphysema: Secondary | ICD-10-CM

## 2024-01-06 DIAGNOSIS — E782 Mixed hyperlipidemia: Secondary | ICD-10-CM

## 2024-01-06 DIAGNOSIS — T466X5A Adverse effect of antihyperlipidemic and antiarteriosclerotic drugs, initial encounter: Secondary | ICD-10-CM

## 2024-01-06 MED ORDER — TAMSULOSIN HCL 0.4 MG PO CAPS: ORAL_CAPSULE | ORAL | 3 refills | Status: AC

## 2024-01-06 MED ORDER — TRELEGY ELLIPTA 100-62.5-25 MCG/ACT IN AEPB
1.0000 | INHALATION_SPRAY | Freq: Every day | RESPIRATORY_TRACT | 5 refills | Status: AC
Start: 2024-01-06 — End: ?

## 2024-01-06 MED ORDER — MONTELUKAST SODIUM 10 MG PO TABS: ORAL_TABLET | ORAL | 3 refills | Status: AC

## 2024-01-06 NOTE — Progress Notes (Unsigned)
 Established Patient Office Visit  Subjective   Patient ID: Jonathan Baker, male    DOB: [birth year]  Age: 63 y.o. MRN: [id number]  Chief Complaint  Patient presents with   Medical Management of Chronic Issues    Med refills    HPI Pt is a 63 yo male with COPD and current smoker, BPH, HLD who presents to the clinic for medication refills and follow up.   BPH-symptoms doing great. No concerns. On flomax and saw palmetto.   COPD- on Trelegy. Continues to smoke. Not using rescue inhaler. On singulair. Continues to have SOB and cough.   Not taking statin because of myalgia and does not believe it is effective.     ROS See HPI.    Objective:     BP 137/73   Pulse 72   Ht 6' (1.829 m)   Wt 164 lb (74.4 kg)   SpO2 99%   BMI 22.24 kg/m  BP Readings from Last 3 Encounters:  01/06/24 137/73  10/10/22 (!) 144/92  09/03/22 (!) 140/92   Wt Readings from Last 3 Encounters:  01/06/24 164 lb (74.4 kg)  11/14/22 160 lb (72.6 kg)  10/10/22 164 lb (74.4 kg)      Physical Exam Constitutional:      Appearance: Normal appearance.  HENT:     Head: Normocephalic.     Right Ear: Tympanic membrane and external ear normal. There is no impacted cerumen.     Left Ear: Tympanic membrane, ear canal and external ear normal. There is no impacted cerumen.     Ears:     Comments: Abrasion in the right ear canal. Cardiovascular:     Rate and Rhythm: Normal rate and regular rhythm.     Pulses: Normal pulses.  Pulmonary:     Effort: Pulmonary effort is normal.     Comments: Rhonchi bilaterally lungs all lung fields Musculoskeletal:     Right lower leg: No edema.     Left lower leg: No edema.  Neurological:     General: No focal deficit present.     Mental Status: He is alert and oriented to person, place, and time.  Psychiatric:        Mood and Affect: Mood normal.        The 10-year ASCVD risk score (Arnett DK, et al., 2019) is: 13.6%    Assessment & Plan:  Marland KitchenMarland KitchenReinhardt was  seen today for medical management of chronic issues.  Diagnoses and all orders for this visit:  Benign prostatic hyperplasia with urinary frequency -     tamsulosin (FLOMAX) 0.4 MG CAPS capsule; TAKE 1 CAPSULE(0.4 MG) BY MOUTH DAILY -     PSA -     CBC w/Diff/Platelet  Centrilobular emphysema (HCC) -     montelukast (SINGULAIR) 10 MG tablet; Take one tablet daily. -     Fluticasone-Umeclidin-Vilant (TRELEGY ELLIPTA) 100-62.5-25 MCG/ACT AEPB; Inhale 1 puff into the lungs daily.  Myalgia due to statin  Statin declined  Screening for diabetes mellitus -     CMP14+EGFR  Mixed hyperlipidemia -     CMP14+EGFR -     CBC w/Diff/Platelet -     Lipid panel  Lung cancer screening declined by patient  Current smoker   Pt declines lung cancer screening and help with smoking cessation, pt aware of risk Refilled Trelegy and singulair Airsupra for rescue as needed.   Pt has stopped statin due to myalgia and does not want to be on any cholesterol medication Lipid  rechecked today Pt aware of CV risk  BPH Refilled flomax Check PSA     Tandy Gaw, PA-C

## 2024-01-07 ENCOUNTER — Encounter: Payer: Self-pay | Admitting: Physician Assistant

## 2024-01-07 DIAGNOSIS — Z5320 Procedure and treatment not carried out because of patient's decision for unspecified reasons: Secondary | ICD-10-CM | POA: Insufficient documentation

## 2024-01-07 DIAGNOSIS — F17200 Nicotine dependence, unspecified, uncomplicated: Secondary | ICD-10-CM | POA: Insufficient documentation

## 2024-01-07 LAB — CBC WITH DIFFERENTIAL/PLATELET
Basophils Absolute: 0 10*3/uL (ref 0.0–0.2)
Basos: 0 %
EOS (ABSOLUTE): 0 10*3/uL (ref 0.0–0.4)
Eos: 0 %
Hematocrit: 41.9 % (ref 37.5–51.0)
Hemoglobin: 14.2 g/dL (ref 13.0–17.7)
Immature Grans (Abs): 0 10*3/uL (ref 0.0–0.1)
Immature Granulocytes: 0 %
Lymphocytes Absolute: 1.5 10*3/uL (ref 0.7–3.1)
Lymphs: 17 %
MCH: 31.4 pg (ref 26.6–33.0)
MCHC: 33.9 g/dL (ref 31.5–35.7)
MCV: 93 fL (ref 79–97)
Monocytes Absolute: 0.7 10*3/uL (ref 0.1–0.9)
Monocytes: 8 %
Neutrophils Absolute: 6.7 10*3/uL (ref 1.4–7.0)
Neutrophils: 75 %
Platelets: 333 10*3/uL (ref 150–450)
RBC: 4.52 x10E6/uL (ref 4.14–5.80)
RDW: 12.4 % (ref 11.6–15.4)
WBC: 9 10*3/uL (ref 3.4–10.8)

## 2024-01-07 LAB — CMP14+EGFR
ALT: 17 IU/L (ref 0–44)
AST: 21 IU/L (ref 0–40)
Albumin: 4.7 g/dL (ref 3.9–4.9)
Alkaline Phosphatase: 106 IU/L (ref 44–121)
BUN/Creatinine Ratio: 9 — ABNORMAL LOW (ref 10–24)
BUN: 8 mg/dL (ref 8–27)
Bilirubin Total: 0.8 mg/dL (ref 0.0–1.2)
CO2: 19 mmol/L — ABNORMAL LOW (ref 20–29)
Calcium: 9.9 mg/dL (ref 8.6–10.2)
Chloride: 103 mmol/L (ref 96–106)
Creatinine, Ser: 0.92 mg/dL (ref 0.76–1.27)
Globulin, Total: 2.4 g/dL (ref 1.5–4.5)
Glucose: 94 mg/dL (ref 70–99)
Potassium: 4.5 mmol/L (ref 3.5–5.2)
Sodium: 140 mmol/L (ref 134–144)
Total Protein: 7.1 g/dL (ref 6.0–8.5)
eGFR: 94 mL/min/{1.73_m2} (ref >59)

## 2024-01-07 LAB — SPECIMEN STATUS REPORT

## 2024-01-07 LAB — LIPID PANEL
Chol/HDL Ratio: 2.8 ratio (ref 0.0–5.0)
Cholesterol, Total: 191 mg/dL (ref 100–199)
HDL: 68 mg/dL (ref >39)
LDL Chol Calc (NIH): 112 mg/dL — ABNORMAL HIGH (ref 0–99)
Triglycerides: 60 mg/dL (ref 0–149)
VLDL Cholesterol Cal: 11 mg/dL (ref 5–40)

## 2024-01-07 LAB — PSA: Prostate Specific Ag, Serum: 0.3 ng/mL (ref 0.0–4.0)

## 2024-01-07 MED ORDER — AIRSUPRA 90-80 MCG/ACT IN AERO: 2.0000 | INHALATION_SPRAY | Freq: Four times a day (QID) | RESPIRATORY_TRACT | 3 refills | Status: AC | PRN

## 2024-01-07 NOTE — Progress Notes (Signed)
 Jonathan Baker,   Cholesterol looks pretty good. Very close to optimal goal.   Kidney and liver look good.   PSA is low! GREAT news.   Labs look great.

## 2024-02-04 ENCOUNTER — Encounter: Payer: Self-pay | Admitting: *Deleted
